# Patient Record
Sex: Female | Born: 1960
Health system: Southern US, Community
[De-identification: ages and names within clinical notes are randomized; demographics above are authoritative.]

## PROBLEM LIST (undated history)

## (undated) DIAGNOSIS — E079 Disorder of thyroid, unspecified: Secondary | ICD-10-CM

## (undated) DIAGNOSIS — F419 Anxiety disorder, unspecified: Secondary | ICD-10-CM

## (undated) DIAGNOSIS — I10 Essential (primary) hypertension: Secondary | ICD-10-CM

## (undated) DIAGNOSIS — E559 Vitamin D deficiency, unspecified: Secondary | ICD-10-CM

## (undated) DIAGNOSIS — J439 Emphysema, unspecified: Secondary | ICD-10-CM

## (undated) HISTORY — DX: Essential (primary) hypertension: I10

## (undated) HISTORY — DX: Disorder of thyroid, unspecified: E07.9

## (undated) HISTORY — PX: CERVIX SURGERY: SHX593

## (undated) HISTORY — DX: Anxiety disorder, unspecified: F41.9

## (undated) HISTORY — DX: Emphysema, unspecified: J43.9

## (undated) HISTORY — DX: Vitamin D deficiency, unspecified: E55.9

## (undated) HISTORY — PX: OTHER SURGICAL HISTORY: SHX169

---

## 2009-08-18 ENCOUNTER — Other Ambulatory Visit: Admission: RE | Admit: 2009-08-18 | Discharge: 2009-08-18 | Payer: Self-pay | Admitting: Family Medicine

## 2009-09-05 ENCOUNTER — Encounter: Admission: RE | Admit: 2009-09-05 | Discharge: 2009-09-05 | Payer: Self-pay | Admitting: Family Medicine

## 2009-09-12 ENCOUNTER — Encounter: Admission: RE | Admit: 2009-09-12 | Discharge: 2009-09-12 | Payer: Self-pay | Admitting: Family Medicine

## 2011-03-13 ENCOUNTER — Other Ambulatory Visit (HOSPITAL_COMMUNITY)
Admission: RE | Admit: 2011-03-13 | Discharge: 2011-03-13 | Disposition: A | Discharge status: Home / Self Care | Payer: 59 | Source: Ambulatory Visit | Attending: Family Medicine | Admitting: Family Medicine

## 2011-03-13 DIAGNOSIS — Z113 Encounter for screening for infections with a predominantly sexual mode of transmission: Secondary | ICD-10-CM | POA: Insufficient documentation

## 2011-03-13 DIAGNOSIS — Z124 Encounter for screening for malignant neoplasm of cervix: Secondary | ICD-10-CM | POA: Insufficient documentation

## 2011-03-16 ENCOUNTER — Other Ambulatory Visit: Payer: Self-pay | Admitting: Family Medicine

## 2011-03-16 DIAGNOSIS — Z1231 Encounter for screening mammogram for malignant neoplasm of breast: Secondary | ICD-10-CM

## 2011-04-04 ENCOUNTER — Ambulatory Visit
Admission: RE | Admit: 2011-04-04 | Discharge: 2011-04-04 | Disposition: A | Discharge status: Home / Self Care | Payer: 59 | Source: Ambulatory Visit | Attending: Family Medicine | Admitting: Family Medicine

## 2011-04-04 DIAGNOSIS — Z1231 Encounter for screening mammogram for malignant neoplasm of breast: Secondary | ICD-10-CM

## 2012-04-28 ENCOUNTER — Other Ambulatory Visit (HOSPITAL_COMMUNITY)
Admission: RE | Admit: 2012-04-28 | Discharge: 2012-04-28 | Disposition: A | Discharge status: Home / Self Care | Payer: BC Managed Care – PPO | Source: Ambulatory Visit | Attending: Family Medicine | Admitting: Family Medicine

## 2012-04-28 ENCOUNTER — Other Ambulatory Visit: Payer: Self-pay | Admitting: Family Medicine

## 2012-04-28 DIAGNOSIS — Z124 Encounter for screening for malignant neoplasm of cervix: Secondary | ICD-10-CM | POA: Insufficient documentation

## 2012-05-13 ENCOUNTER — Other Ambulatory Visit: Payer: Self-pay | Admitting: Family Medicine

## 2012-05-13 DIAGNOSIS — Z1231 Encounter for screening mammogram for malignant neoplasm of breast: Secondary | ICD-10-CM

## 2012-06-16 ENCOUNTER — Ambulatory Visit
Admission: RE | Admit: 2012-06-16 | Discharge: 2012-06-16 | Disposition: A | Discharge status: Home / Self Care | Payer: BC Managed Care – PPO | Source: Ambulatory Visit | Attending: Family Medicine | Admitting: Family Medicine

## 2012-06-16 DIAGNOSIS — Z1231 Encounter for screening mammogram for malignant neoplasm of breast: Secondary | ICD-10-CM

## 2013-03-24 ENCOUNTER — Other Ambulatory Visit: Payer: Self-pay | Admitting: Family Medicine

## 2013-03-24 ENCOUNTER — Ambulatory Visit
Admission: RE | Admit: 2013-03-24 | Discharge: 2013-03-24 | Disposition: A | Discharge status: Home / Self Care | Payer: BC Managed Care – PPO | Source: Ambulatory Visit | Attending: Family Medicine | Admitting: Family Medicine

## 2013-03-24 DIAGNOSIS — R109 Unspecified abdominal pain: Secondary | ICD-10-CM

## 2013-03-24 DIAGNOSIS — R102 Pelvic and perineal pain: Secondary | ICD-10-CM

## 2013-03-24 MED ORDER — IOHEXOL 300 MG/ML  SOLN: 100.0000 mL | Freq: Once | INTRAMUSCULAR | Status: AC | PRN

## 2013-05-13 ENCOUNTER — Other Ambulatory Visit: Payer: Self-pay | Admitting: Gastroenterology

## 2013-07-16 ENCOUNTER — Emergency Department (HOSPITAL_COMMUNITY)
Admission: EM | Admit: 2013-07-16 | Discharge: 2013-07-16 | Disposition: A | Discharge status: Home / Self Care | Payer: BC Managed Care – PPO | Source: Home / Self Care | Attending: Emergency Medicine | Admitting: Emergency Medicine

## 2013-07-16 ENCOUNTER — Encounter (HOSPITAL_COMMUNITY): Payer: Self-pay | Admitting: Emergency Medicine

## 2013-07-16 DIAGNOSIS — N3000 Acute cystitis without hematuria: Secondary | ICD-10-CM

## 2013-07-16 LAB — POCT URINALYSIS DIP (DEVICE)
Glucose, UA: NEGATIVE mg/dL
Hgb urine dipstick: NEGATIVE
Nitrite: NEGATIVE
Protein, ur: NEGATIVE mg/dL
Specific Gravity, Urine: 1.015 (ref 1.005–1.030)
Urobilinogen, UA: 0.2 mg/dL (ref 0.0–1.0)
pH: 7 (ref 5.0–8.0)

## 2013-07-16 MED ORDER — PHENAZOPYRIDINE HCL 200 MG PO TABS: 200.0000 mg | ORAL_TABLET | Freq: Three times a day (TID) | ORAL | Status: DC | PRN

## 2013-07-16 MED ORDER — CEPHALEXIN 500 MG PO CAPS: 500.0000 mg | ORAL_CAPSULE | Freq: Three times a day (TID) | ORAL | Status: DC

## 2013-07-16 NOTE — ED Notes (Signed)
Pt  Reports  Symptoms  Of  Dysuria        And  Sensation of  Not  Fully  emptying bladder       She  denys  Any other  Symptoms  She  Reports  Symptoms  Began this  Am

## 2013-07-16 NOTE — ED Provider Notes (Signed)
Chief Complaint:   Chief Complaint  Patient presents with  . Dysuria    History of Present Illness:   Alicia Rubio is a 52 year old female who has had a history since this morning of pain with urination and suprapubic pain. She denies any frequency, urgency, hematuria, malodorous urine, fever, chills, nausea, or vomiting. She's had no GYN complaints. The patient states she has had urinary tract infections in the past. Most recently 8 years ago.  Review of Systems:  Other than noted above, the patient denies any of the following symptoms: General:  No fevers, chills, sweats, aches, or fatigue. GI:  No abdominal pain, back pain, nausea, vomiting, diarrhea, or constipation. GU:  No dysuria, frequency, urgency, hematuria, or incontinence. GYN:  No discharge, itching, vulvar pain or lesions, pelvic pain, or abnormal vaginal bleeding.  PMFSH:  Past medical history, family history, social history, meds, and allergies were reviewed.  She is allergic to minocycline. She has high blood pressure and hypothyroidism. She takes Effexor, Synthroid, a blood pressure pill, Xanax, and Ambien.  Physical Exam:   Vital signs:  BP 172/94  Pulse 89  Temp(Src) 98.7 F (37.1 C) (Oral)  Resp 16  Ht 5\' 2"  (1.575 m)  Wt 125 lb (56.7 kg)  BMI 22.86 kg/m2  SpO2 98% Gen:  Alert, oriented, in no distress. Lungs:  Clear to auscultation, no wheezes, rales or rhonchi. Heart:  Regular rhythm, no gallop or murmer. Abdomen:  Flat and soft. There was slight suprapubic pain to palpation.  No guarding, or rebound.  No hepato-splenomegaly or mass.  Bowel sounds were normally active.  No hernia. Back:  No CVA tenderness.  Skin:  Clear, warm and dry.  Labs:    Results for orders placed during the hospital encounter of 07/16/13  POCT URINALYSIS DIP (DEVICE)      Result Value Range   Glucose, UA NEGATIVE  NEGATIVE mg/dL   Bilirubin Urine NEGATIVE  NEGATIVE   Ketones, ur NEGATIVE  NEGATIVE mg/dL   Specific Gravity, Urine  1.015  1.005 - 1.030   Hgb urine dipstick NEGATIVE  NEGATIVE   pH 7.0  5.0 - 8.0   Protein, ur NEGATIVE  NEGATIVE mg/dL   Urobilinogen, UA 0.2  0.0 - 1.0 mg/dL   Nitrite NEGATIVE  NEGATIVE   Leukocytes, UA NEGATIVE  NEGATIVE     A urine culture was obtained.  Results are pending at this time and we will call about any positive results.  Assessment: The encounter diagnosis was Acute cystitis.   No evidence of acute pyelonephritis.  Plan:   1.  Meds:  The following meds were prescribed:   Discharge Medication List as of 07/16/2013 12:26 PM    START taking these medications   Details  cephALEXin (KEFLEX) 500 MG capsule Take 1 capsule (500 mg total) by mouth 3 (three) times daily., Starting 07/16/2013, Until Discontinued, Normal    phenazopyridine (PYRIDIUM) 200 MG tablet Take 1 tablet (200 mg total) by mouth 3 (three) times daily as needed for pain., Starting 07/16/2013, Until Discontinued, Normal        2.  Patient Education/Counseling:  The patient was given appropriate handouts, self care instructions, and instructed in symptomatic relief. The patient was told to avoid intercourse for 10 days, get extra fluids, and return for a follow up with her primary care doctor at the completion of treatment for a repeat UA and culture.   3.  Follow up:  The patient was told to follow up if no better  in 3 to 4 days, if becoming worse in any way, and given some red flag symptoms such as fever, severe flank pain, or persistent vomiting which would prompt immediate return.  Follow up here or at the emergency department as needed.     Reuben Likes, MD 07/16/13 1336

## 2013-07-17 LAB — URINE CULTURE

## 2013-09-04 ENCOUNTER — Other Ambulatory Visit: Payer: Self-pay

## 2013-09-04 DIAGNOSIS — Z1231 Encounter for screening mammogram for malignant neoplasm of breast: Secondary | ICD-10-CM

## 2013-09-16 ENCOUNTER — Ambulatory Visit
Admission: RE | Admit: 2013-09-16 | Discharge: 2013-09-16 | Disposition: A | Discharge status: Home / Self Care | Payer: BC Managed Care – PPO | Source: Ambulatory Visit

## 2013-09-16 DIAGNOSIS — Z1231 Encounter for screening mammogram for malignant neoplasm of breast: Secondary | ICD-10-CM

## 2014-09-27 ENCOUNTER — Other Ambulatory Visit (HOSPITAL_COMMUNITY)
Admission: RE | Admit: 2014-09-27 | Discharge: 2014-09-27 | Disposition: A | Discharge status: Home / Self Care | Payer: BLUE CROSS/BLUE SHIELD | Source: Ambulatory Visit | Attending: Family Medicine | Admitting: Family Medicine

## 2014-09-27 ENCOUNTER — Other Ambulatory Visit: Payer: Self-pay | Admitting: Family Medicine

## 2014-09-27 DIAGNOSIS — Z1151 Encounter for screening for human papillomavirus (HPV): Secondary | ICD-10-CM | POA: Insufficient documentation

## 2014-09-27 DIAGNOSIS — Z124 Encounter for screening for malignant neoplasm of cervix: Secondary | ICD-10-CM | POA: Diagnosis not present

## 2014-09-29 LAB — CYTOLOGY - PAP

## 2014-10-08 ENCOUNTER — Other Ambulatory Visit: Payer: Self-pay

## 2014-10-08 DIAGNOSIS — Z1231 Encounter for screening mammogram for malignant neoplasm of breast: Secondary | ICD-10-CM

## 2014-10-13 ENCOUNTER — Ambulatory Visit
Admission: RE | Admit: 2014-10-13 | Discharge: 2014-10-13 | Disposition: A | Discharge status: Home / Self Care | Payer: BLUE CROSS/BLUE SHIELD | Source: Ambulatory Visit

## 2014-10-13 DIAGNOSIS — Z1231 Encounter for screening mammogram for malignant neoplasm of breast: Secondary | ICD-10-CM

## 2015-11-16 ENCOUNTER — Other Ambulatory Visit: Payer: Self-pay | Admitting: Family Medicine

## 2015-11-16 DIAGNOSIS — I10 Essential (primary) hypertension: Secondary | ICD-10-CM | POA: Diagnosis not present

## 2015-11-16 DIAGNOSIS — E039 Hypothyroidism, unspecified: Secondary | ICD-10-CM | POA: Diagnosis not present

## 2015-11-16 DIAGNOSIS — Z0000 Encounter for general adult medical examination without abnormal findings: Secondary | ICD-10-CM | POA: Diagnosis not present

## 2015-11-16 DIAGNOSIS — R202 Paresthesia of skin: Secondary | ICD-10-CM

## 2015-11-21 ENCOUNTER — Inpatient Hospital Stay: Admission: RE | Admit: 2015-11-21 | Payer: BLUE CROSS/BLUE SHIELD | Source: Ambulatory Visit

## 2015-11-22 ENCOUNTER — Other Ambulatory Visit: Payer: Self-pay

## 2015-11-22 DIAGNOSIS — Z1231 Encounter for screening mammogram for malignant neoplasm of breast: Secondary | ICD-10-CM

## 2015-12-08 ENCOUNTER — Ambulatory Visit
Admission: RE | Admit: 2015-12-08 | Discharge: 2015-12-08 | Disposition: A | Discharge status: Home / Self Care | Payer: BLUE CROSS/BLUE SHIELD | Source: Ambulatory Visit

## 2015-12-08 DIAGNOSIS — Z1231 Encounter for screening mammogram for malignant neoplasm of breast: Secondary | ICD-10-CM

## 2016-06-08 DIAGNOSIS — E039 Hypothyroidism, unspecified: Secondary | ICD-10-CM | POA: Diagnosis not present

## 2016-06-08 DIAGNOSIS — I10 Essential (primary) hypertension: Secondary | ICD-10-CM | POA: Diagnosis not present

## 2016-06-08 DIAGNOSIS — Z720 Tobacco use: Secondary | ICD-10-CM | POA: Diagnosis not present

## 2016-06-08 DIAGNOSIS — F322 Major depressive disorder, single episode, severe without psychotic features: Secondary | ICD-10-CM | POA: Diagnosis not present

## 2016-06-11 ENCOUNTER — Ambulatory Visit (INDEPENDENT_AMBULATORY_CARE_PROVIDER_SITE_OTHER): Payer: BLUE CROSS/BLUE SHIELD | Admitting: Orthopaedic Surgery

## 2016-06-11 ENCOUNTER — Encounter (INDEPENDENT_AMBULATORY_CARE_PROVIDER_SITE_OTHER): Payer: Self-pay | Admitting: Orthopaedic Surgery

## 2016-06-11 DIAGNOSIS — G5622 Lesion of ulnar nerve, left upper limb: Secondary | ICD-10-CM | POA: Diagnosis not present

## 2016-06-11 NOTE — Progress Notes (Signed)
   Office Visit Note   Patient: Alicia Rubio           Date of Birth: January 30, 1961           MRN: [id number] Visit Date: 06/11/2016              Requested by: Mayra Neer, MD [address] [place], [place] [address] PCP: No PCP Per Patient   Assessment & Plan: Visit Diagnoses: No diagnosis found.  Plan:  - findings c/w ulnar neuritis - educated patient on elbow positioning and activities to avoid - nsaids prn - if not better will get NCV  Follow-Up Instructions: Return if symptoms worsen or fail to improve.   Orders:  No orders of the defined types were placed in this encounter.  No orders of the defined types were placed in this encounter.     Procedures: No procedures performed   Clinical Data: No additional findings.   Subjective: Chief Complaint  Patient presents with  . Left Arm - Numbness  . Left Hand - Pain    55 yo female with left arm numbness worse with elbow flexion and occasionally wakes up with arm numbness.  Not really having pain, more so numbness.  Denies any focal deficits.  Occasional neck pain but no real radicular sxs.  Keeping elbow straight is better.  Denies f/c/n/v.    Review of Systems  Constitutional: Negative.   HENT: Negative.   Eyes: Negative.   Respiratory: Negative.   Cardiovascular: Negative.   Endocrine: Negative.   Musculoskeletal: Negative.   Neurological: Negative.   Hematological: Negative.   Psychiatric/Behavioral: Negative.   All other systems reviewed and are negative.    Objective: Vital Signs: There were no vitals taken for this visit.  Physical Exam  Constitutional: She is oriented to person, place, and time. She appears well-developed and well-nourished.  HENT:  Head: Atraumatic.  Eyes: EOM are normal.  Neck: Neck supple.  Cardiovascular: Intact distal pulses.   Pulmonary/Chest: Effort normal.  Abdominal: Soft.  Neurological: She is alert and oriented to person, place, and time.  Skin:  Skin is warm. Capillary refill takes less than 2 seconds.  Psychiatric: She has a normal mood and affect. Her behavior is normal. Judgment and thought content normal.  Nursing note and vitals reviewed.   Left Hand Exam   Muscle Strength  The patient has normal left wrist strength.  Tests  Phalen's Sign: negative Tinel's Sign (Medial Nerve): positive  Comments:  Positive durkan's.  Negative muscular atrophy.  Negative froment's, wartenberg.   Left Elbow Exam   Range of Motion  The patient has normal left elbow ROM.  Muscle Strength  The patient has normal left elbow strength.  Tests Varus: negative Valgus: negative Tinel's Sign (cubital tunnel): positive  Other  Erythema: absent Scars: absent Sensation: normal Pulse: present      Specialty Comments:  No specialty comments available.  Imaging: No results found.   PMFS History: There are no active problems to display for this patient.  No past medical history on file.  No family history on file.  No past surgical history on file. Social History   Occupational History  . Not on file.   Social History Main Topics  . Smoking status: Current Every Day Smoker  . Smokeless tobacco: Never Used  . Alcohol use Not on file  . Drug use: Unknown  . Sexual activity: Not on file

## 2016-07-24 DIAGNOSIS — L308 Other specified dermatitis: Secondary | ICD-10-CM | POA: Diagnosis not present

## 2016-07-24 DIAGNOSIS — B360 Pityriasis versicolor: Secondary | ICD-10-CM | POA: Diagnosis not present

## 2016-09-24 ENCOUNTER — Ambulatory Visit
Admission: RE | Admit: 2016-09-24 | Discharge: 2016-09-24 | Disposition: A | Discharge status: Home / Self Care | Payer: BLUE CROSS/BLUE SHIELD | Source: Ambulatory Visit | Attending: Family Medicine | Admitting: Family Medicine

## 2016-09-24 ENCOUNTER — Other Ambulatory Visit: Payer: Self-pay | Admitting: Family Medicine

## 2016-09-24 DIAGNOSIS — R202 Paresthesia of skin: Secondary | ICD-10-CM | POA: Diagnosis not present

## 2016-09-24 DIAGNOSIS — E039 Hypothyroidism, unspecified: Secondary | ICD-10-CM | POA: Diagnosis not present

## 2016-09-24 DIAGNOSIS — R05 Cough: Secondary | ICD-10-CM

## 2016-09-24 DIAGNOSIS — R059 Cough, unspecified: Secondary | ICD-10-CM

## 2016-09-24 DIAGNOSIS — F322 Major depressive disorder, single episode, severe without psychotic features: Secondary | ICD-10-CM | POA: Diagnosis not present

## 2016-09-24 DIAGNOSIS — Z720 Tobacco use: Secondary | ICD-10-CM | POA: Diagnosis not present

## 2016-10-08 DIAGNOSIS — F322 Major depressive disorder, single episode, severe without psychotic features: Secondary | ICD-10-CM | POA: Diagnosis not present

## 2016-10-08 DIAGNOSIS — Z720 Tobacco use: Secondary | ICD-10-CM | POA: Diagnosis not present

## 2016-10-08 DIAGNOSIS — J42 Unspecified chronic bronchitis: Secondary | ICD-10-CM | POA: Diagnosis not present

## 2016-10-08 DIAGNOSIS — J189 Pneumonia, unspecified organism: Secondary | ICD-10-CM | POA: Diagnosis not present

## 2016-10-29 DIAGNOSIS — Z720 Tobacco use: Secondary | ICD-10-CM | POA: Diagnosis not present

## 2016-10-29 DIAGNOSIS — F322 Major depressive disorder, single episode, severe without psychotic features: Secondary | ICD-10-CM | POA: Diagnosis not present

## 2016-10-29 DIAGNOSIS — G4700 Insomnia, unspecified: Secondary | ICD-10-CM | POA: Diagnosis not present

## 2016-11-23 DIAGNOSIS — G4700 Insomnia, unspecified: Secondary | ICD-10-CM | POA: Diagnosis not present

## 2016-11-23 DIAGNOSIS — F322 Major depressive disorder, single episode, severe without psychotic features: Secondary | ICD-10-CM | POA: Diagnosis not present

## 2016-11-23 DIAGNOSIS — Z720 Tobacco use: Secondary | ICD-10-CM | POA: Diagnosis not present

## 2016-12-26 ENCOUNTER — Other Ambulatory Visit: Payer: Self-pay | Admitting: Family Medicine

## 2016-12-26 DIAGNOSIS — Z1231 Encounter for screening mammogram for malignant neoplasm of breast: Secondary | ICD-10-CM

## 2017-01-04 DIAGNOSIS — Z0000 Encounter for general adult medical examination without abnormal findings: Secondary | ICD-10-CM | POA: Diagnosis not present

## 2017-01-04 DIAGNOSIS — I10 Essential (primary) hypertension: Secondary | ICD-10-CM | POA: Diagnosis not present

## 2017-01-04 DIAGNOSIS — E039 Hypothyroidism, unspecified: Secondary | ICD-10-CM | POA: Diagnosis not present

## 2017-01-15 ENCOUNTER — Ambulatory Visit
Admission: RE | Admit: 2017-01-15 | Discharge: 2017-01-15 | Disposition: A | Discharge status: Home / Self Care | Payer: BLUE CROSS/BLUE SHIELD | Source: Ambulatory Visit | Attending: Family Medicine | Admitting: Family Medicine

## 2017-01-15 DIAGNOSIS — Z1231 Encounter for screening mammogram for malignant neoplasm of breast: Secondary | ICD-10-CM

## 2017-02-12 DIAGNOSIS — J343 Hypertrophy of nasal turbinates: Secondary | ICD-10-CM | POA: Diagnosis not present

## 2017-02-12 DIAGNOSIS — F17210 Nicotine dependence, cigarettes, uncomplicated: Secondary | ICD-10-CM | POA: Diagnosis not present

## 2017-02-12 DIAGNOSIS — K219 Gastro-esophageal reflux disease without esophagitis: Secondary | ICD-10-CM | POA: Diagnosis not present

## 2017-02-19 DIAGNOSIS — R945 Abnormal results of liver function studies: Secondary | ICD-10-CM | POA: Diagnosis not present

## 2017-04-16 DIAGNOSIS — K219 Gastro-esophageal reflux disease without esophagitis: Secondary | ICD-10-CM | POA: Diagnosis not present

## 2017-04-16 DIAGNOSIS — F458 Other somatoform disorders: Secondary | ICD-10-CM | POA: Diagnosis not present

## 2017-06-19 DIAGNOSIS — F458 Other somatoform disorders: Secondary | ICD-10-CM | POA: Diagnosis not present

## 2017-07-10 DIAGNOSIS — E039 Hypothyroidism, unspecified: Secondary | ICD-10-CM | POA: Diagnosis not present

## 2017-07-10 DIAGNOSIS — I10 Essential (primary) hypertension: Secondary | ICD-10-CM | POA: Diagnosis not present

## 2017-07-10 DIAGNOSIS — G4700 Insomnia, unspecified: Secondary | ICD-10-CM | POA: Diagnosis not present

## 2017-07-10 DIAGNOSIS — Z720 Tobacco use: Secondary | ICD-10-CM | POA: Diagnosis not present

## 2017-07-16 DIAGNOSIS — M79601 Pain in right arm: Secondary | ICD-10-CM | POA: Diagnosis not present

## 2017-07-16 DIAGNOSIS — M542 Cervicalgia: Secondary | ICD-10-CM | POA: Diagnosis not present

## 2017-07-16 DIAGNOSIS — M79602 Pain in left arm: Secondary | ICD-10-CM | POA: Diagnosis not present

## 2017-07-16 DIAGNOSIS — M792 Neuralgia and neuritis, unspecified: Secondary | ICD-10-CM | POA: Diagnosis not present

## 2017-08-16 DIAGNOSIS — M792 Neuralgia and neuritis, unspecified: Secondary | ICD-10-CM | POA: Diagnosis not present

## 2017-08-16 DIAGNOSIS — M542 Cervicalgia: Secondary | ICD-10-CM | POA: Diagnosis not present

## 2017-08-16 DIAGNOSIS — M79602 Pain in left arm: Secondary | ICD-10-CM | POA: Diagnosis not present

## 2017-08-16 DIAGNOSIS — M79601 Pain in right arm: Secondary | ICD-10-CM | POA: Diagnosis not present

## 2017-08-21 DIAGNOSIS — M79602 Pain in left arm: Secondary | ICD-10-CM | POA: Diagnosis not present

## 2017-08-21 DIAGNOSIS — M542 Cervicalgia: Secondary | ICD-10-CM | POA: Diagnosis not present

## 2017-08-21 DIAGNOSIS — M792 Neuralgia and neuritis, unspecified: Secondary | ICD-10-CM | POA: Diagnosis not present

## 2017-08-21 DIAGNOSIS — M79601 Pain in right arm: Secondary | ICD-10-CM | POA: Diagnosis not present

## 2017-08-28 DIAGNOSIS — M79601 Pain in right arm: Secondary | ICD-10-CM | POA: Diagnosis not present

## 2017-08-28 DIAGNOSIS — M79602 Pain in left arm: Secondary | ICD-10-CM | POA: Diagnosis not present

## 2017-08-28 DIAGNOSIS — M792 Neuralgia and neuritis, unspecified: Secondary | ICD-10-CM | POA: Diagnosis not present

## 2017-08-28 DIAGNOSIS — M542 Cervicalgia: Secondary | ICD-10-CM | POA: Diagnosis not present

## 2018-02-03 DIAGNOSIS — Z0000 Encounter for general adult medical examination without abnormal findings: Secondary | ICD-10-CM | POA: Diagnosis not present

## 2018-02-03 DIAGNOSIS — I10 Essential (primary) hypertension: Secondary | ICD-10-CM | POA: Diagnosis not present

## 2018-02-03 DIAGNOSIS — E039 Hypothyroidism, unspecified: Secondary | ICD-10-CM | POA: Diagnosis not present

## 2018-02-05 ENCOUNTER — Other Ambulatory Visit: Payer: Self-pay | Admitting: Family Medicine

## 2018-02-05 DIAGNOSIS — Z1231 Encounter for screening mammogram for malignant neoplasm of breast: Secondary | ICD-10-CM

## 2018-02-26 ENCOUNTER — Ambulatory Visit
Admission: RE | Admit: 2018-02-26 | Discharge: 2018-02-26 | Disposition: A | Discharge status: Home / Self Care | Payer: BLUE CROSS/BLUE SHIELD | Source: Ambulatory Visit | Attending: Family Medicine | Admitting: Family Medicine

## 2018-02-26 ENCOUNTER — Other Ambulatory Visit: Payer: Self-pay | Admitting: Family Medicine

## 2018-02-26 DIAGNOSIS — R059 Cough, unspecified: Secondary | ICD-10-CM

## 2018-02-26 DIAGNOSIS — Z1231 Encounter for screening mammogram for malignant neoplasm of breast: Secondary | ICD-10-CM | POA: Diagnosis not present

## 2018-02-26 DIAGNOSIS — R05 Cough: Secondary | ICD-10-CM

## 2018-06-04 DIAGNOSIS — R197 Diarrhea, unspecified: Secondary | ICD-10-CM | POA: Diagnosis not present

## 2018-08-11 DIAGNOSIS — F322 Major depressive disorder, single episode, severe without psychotic features: Secondary | ICD-10-CM | POA: Diagnosis not present

## 2018-08-11 DIAGNOSIS — E039 Hypothyroidism, unspecified: Secondary | ICD-10-CM | POA: Diagnosis not present

## 2018-08-11 DIAGNOSIS — I10 Essential (primary) hypertension: Secondary | ICD-10-CM | POA: Diagnosis not present

## 2018-08-11 DIAGNOSIS — G4700 Insomnia, unspecified: Secondary | ICD-10-CM | POA: Diagnosis not present

## 2018-11-24 DIAGNOSIS — J42 Unspecified chronic bronchitis: Secondary | ICD-10-CM | POA: Diagnosis not present

## 2018-11-24 DIAGNOSIS — Z720 Tobacco use: Secondary | ICD-10-CM | POA: Diagnosis not present

## 2018-11-24 DIAGNOSIS — I10 Essential (primary) hypertension: Secondary | ICD-10-CM | POA: Diagnosis not present

## 2018-11-24 DIAGNOSIS — K047 Periapical abscess without sinus: Secondary | ICD-10-CM | POA: Diagnosis not present

## 2019-01-06 IMAGING — DX DG CHEST 2V
2 series · 2 of 2 positions shown · non-contrast
Comparison: None in PACs

CLINICAL DATA: Cough, chest congestion for the past 3 weeks. The
patient is a current smoker.

EXAM:
CHEST  2 VIEW

[dg chest 2 view (1 of 2)]
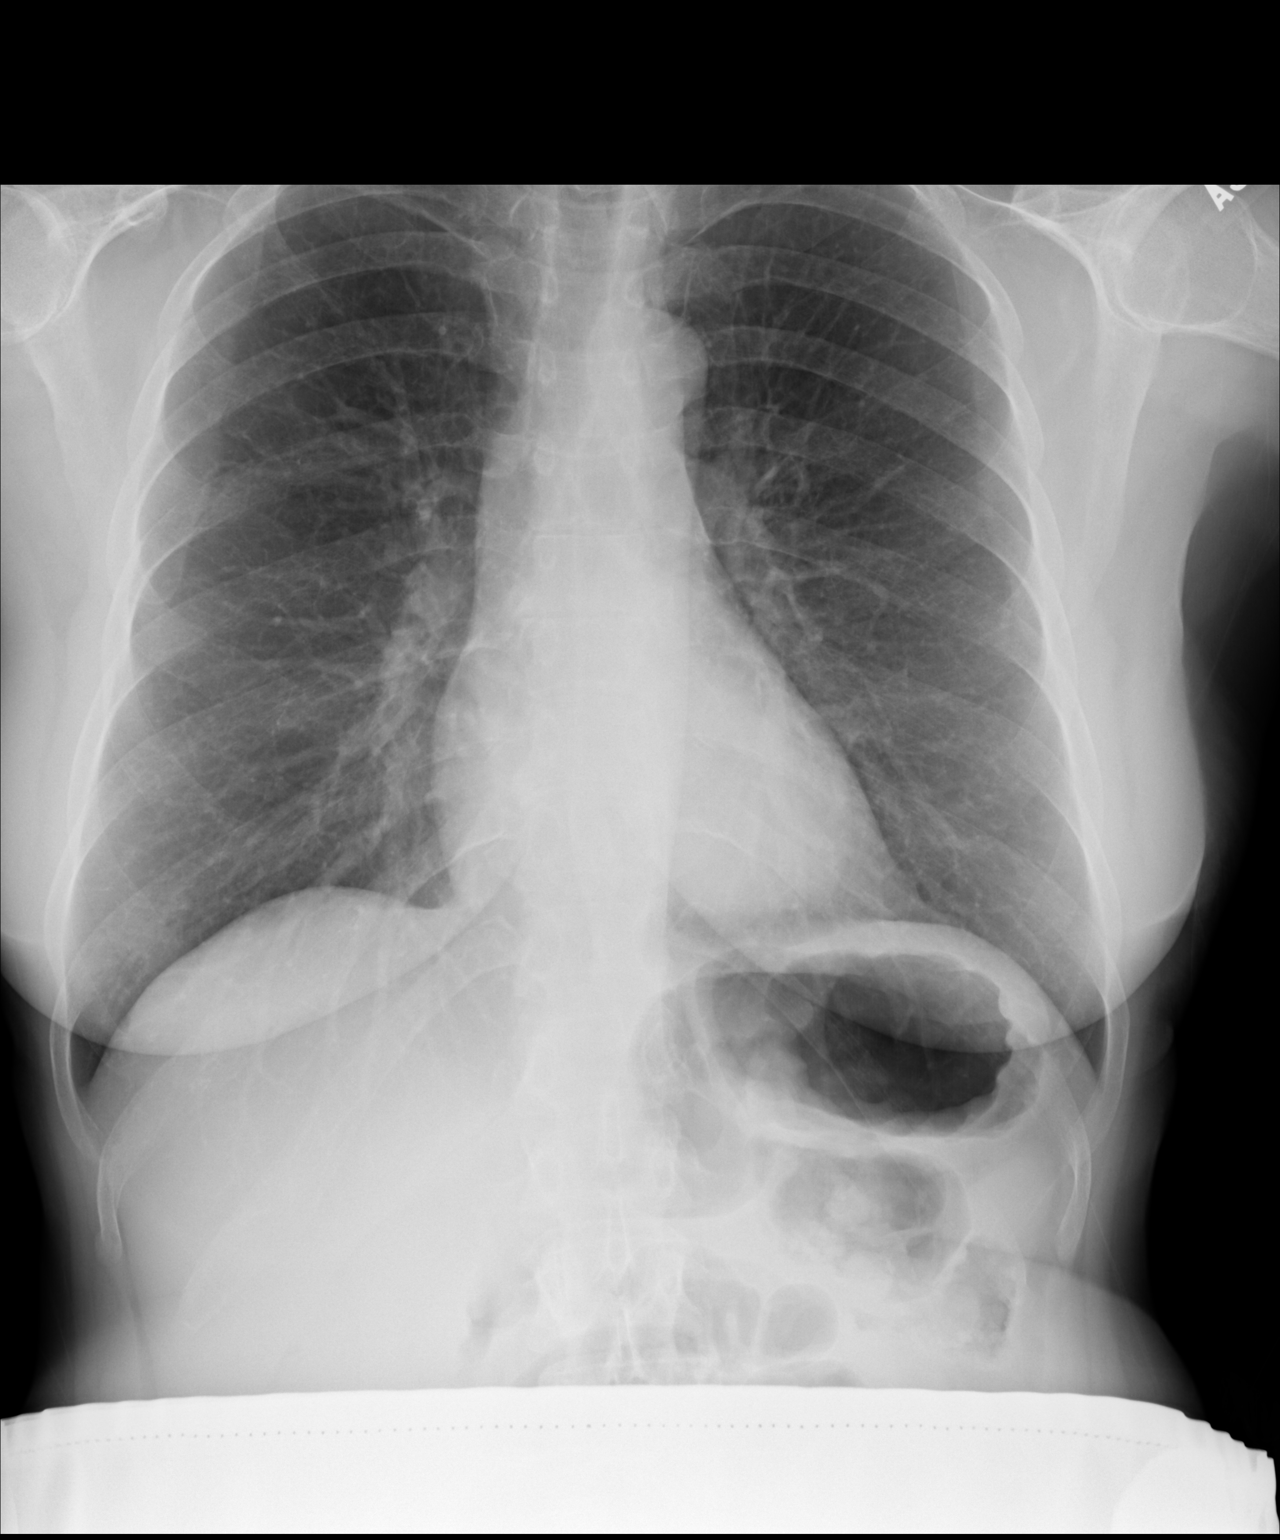

[dg chest 2 view (2 of 2)]
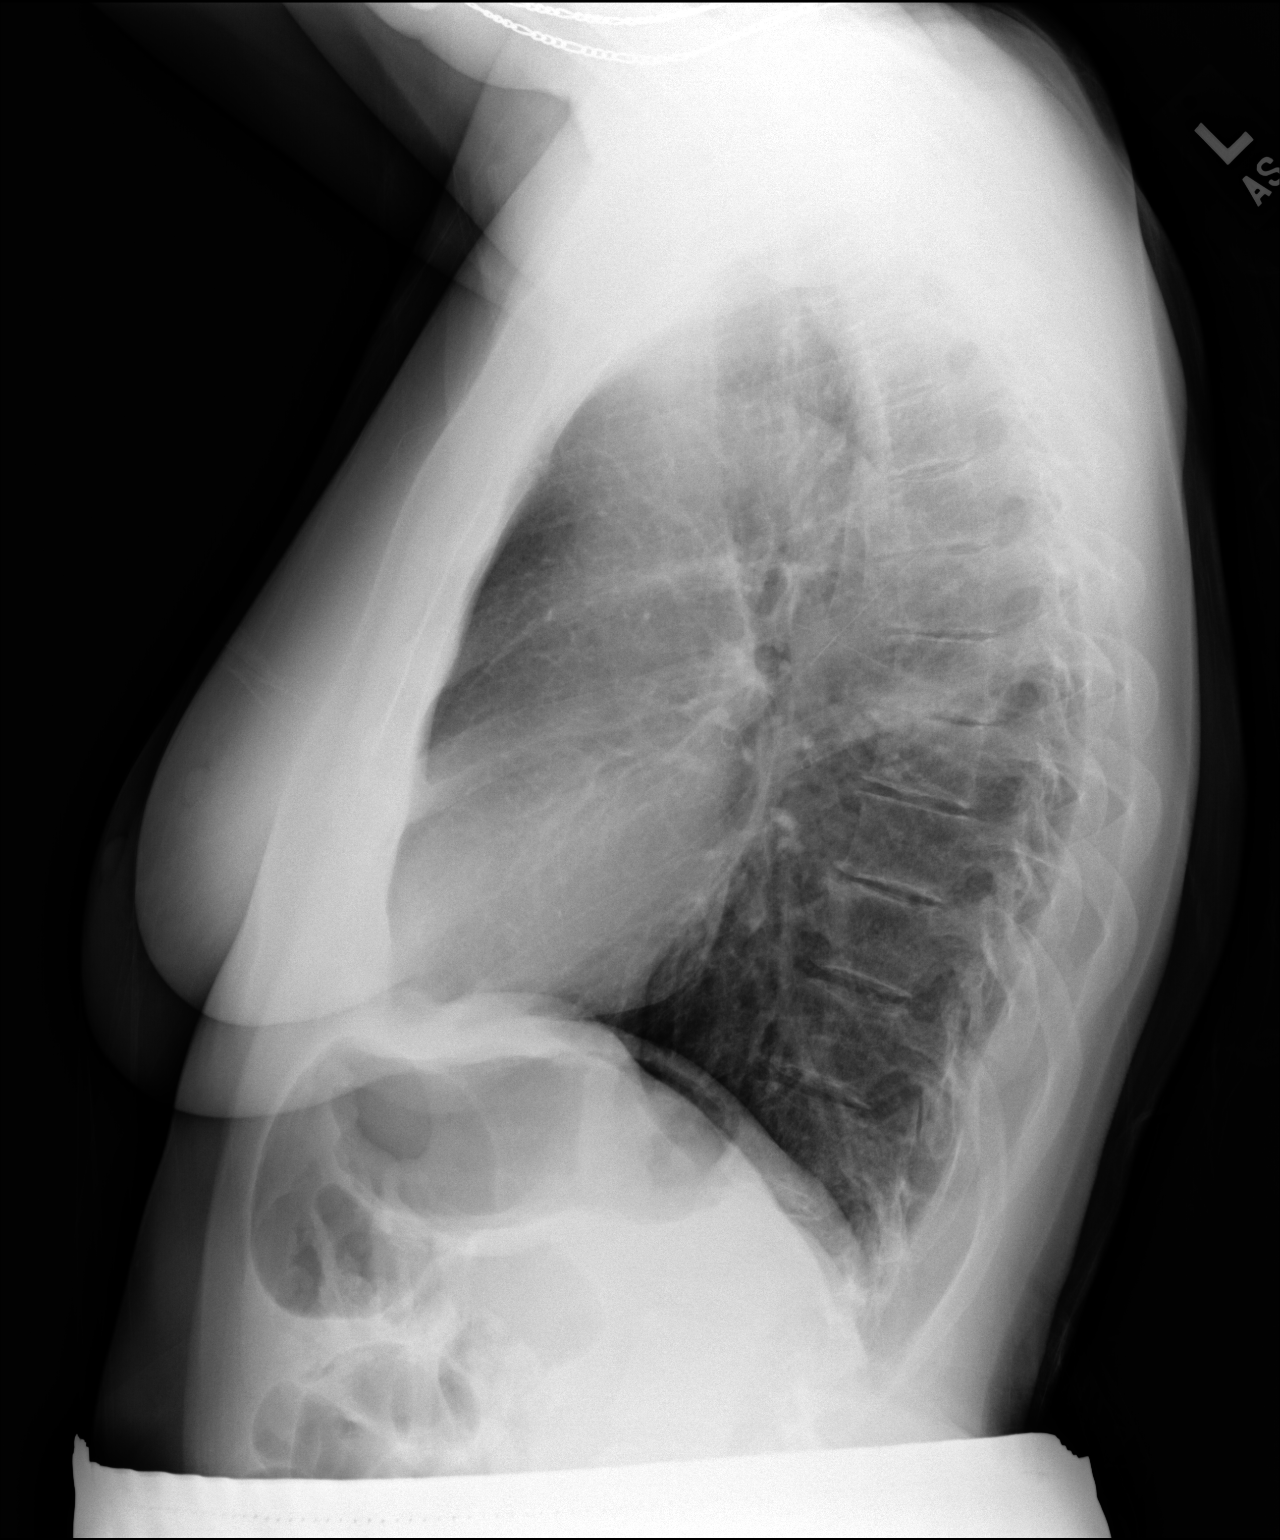

[2 of 2 positions shown; findings below may reference images not displayed]

FINDINGS: The lungs are adequately inflated. There is subtle increased density
in the mid thorax posteriorly on the lateral view that may reflect
atelectasis or pneumonia likely in the superior segment of the left
lower lobe. There is no pleural effusion. The heart and pulmonary
vascularity are normal. There is calcification in the wall of the
aortic arch. The mediastinum is normal in width. There is multilevel
degenerative disc disease of the thoracic spine.
IMPRESSION: Increased density likely in the superior segment of the left lower
lobe suggests atelectasis or pneumonia. Probable underlying chronic
bronchitic changes. Followup PA and lateral chest X-ray is
recommended in 3-4 weeks following trial of antibiotic therapy to
ensure resolution and exclude underlying malignancy.

Thoracic aortic atherosclerosis.

## 2019-02-02 DIAGNOSIS — H6122 Impacted cerumen, left ear: Secondary | ICD-10-CM | POA: Diagnosis not present

## 2019-02-20 DIAGNOSIS — Z0000 Encounter for general adult medical examination without abnormal findings: Secondary | ICD-10-CM | POA: Diagnosis not present

## 2019-02-25 DIAGNOSIS — R05 Cough: Secondary | ICD-10-CM | POA: Diagnosis not present

## 2019-02-25 DIAGNOSIS — J029 Acute pharyngitis, unspecified: Secondary | ICD-10-CM | POA: Diagnosis not present

## 2019-02-28 ENCOUNTER — Other Ambulatory Visit: Payer: Self-pay

## 2019-02-28 ENCOUNTER — Emergency Department (HOSPITAL_COMMUNITY)
Admission: EM | Admit: 2019-02-28 | Discharge: 2019-02-28 | Disposition: A | Discharge status: Home / Self Care | Payer: BC Managed Care – PPO | Attending: Emergency Medicine | Admitting: Emergency Medicine

## 2019-02-28 ENCOUNTER — Emergency Department (HOSPITAL_COMMUNITY): Payer: BC Managed Care – PPO

## 2019-02-28 ENCOUNTER — Encounter (HOSPITAL_COMMUNITY): Payer: Self-pay | Admitting: Emergency Medicine

## 2019-02-28 DIAGNOSIS — Z79899 Other long term (current) drug therapy: Secondary | ICD-10-CM | POA: Diagnosis not present

## 2019-02-28 DIAGNOSIS — R05 Cough: Secondary | ICD-10-CM | POA: Insufficient documentation

## 2019-02-28 DIAGNOSIS — F17200 Nicotine dependence, unspecified, uncomplicated: Secondary | ICD-10-CM | POA: Insufficient documentation

## 2019-02-28 DIAGNOSIS — J029 Acute pharyngitis, unspecified: Secondary | ICD-10-CM | POA: Diagnosis not present

## 2019-02-28 DIAGNOSIS — Z20828 Contact with and (suspected) exposure to other viral communicable diseases: Secondary | ICD-10-CM | POA: Diagnosis not present

## 2019-02-28 DIAGNOSIS — R059 Cough, unspecified: Secondary | ICD-10-CM

## 2019-02-28 LAB — GROUP A STREP BY PCR: Group A Strep by PCR: NOT DETECTED

## 2019-02-28 MED ORDER — AZITHROMYCIN 250 MG PO TABS: 250.0000 mg | ORAL_TABLET | Freq: Every day | ORAL | 0 refills | Status: DC

## 2019-02-28 MED ORDER — PREDNISONE 50 MG PO TABS: 50.0000 mg | ORAL_TABLET | Freq: Every day | ORAL | 0 refills | Status: DC

## 2019-02-28 MED ORDER — HYDROCOD POLST-CPM POLST ER 10-8 MG/5ML PO SUER: 5.0000 mL | Freq: Two times a day (BID) | ORAL | 0 refills | Status: DC | PRN

## 2019-02-28 NOTE — ED Provider Notes (Signed)
[facility] EMERGENCY DEPARTMENT Provider Note   CSN: [id number] Arrival date & time: 02/28/19  1248     History   Chief Complaint Chief Complaint  Patient presents with  . Cough  . Sore Throat    HPI Alicia Rubio is a 58 y.o. female who presents with a one-week history of cough and sore throat.  She denies any fever, chest pain, shortness of breath.  Her cough is much worse at night and when she lies down.  Her husband had similar symptoms last week and he is over it.  She has been taking over-the-counter Delsym and Tylenol without significant relief.  Patient smokes 3 packs a day.  She denies any loss of taste or smell, abdominal pain, nausea, vomiting, diarrhea.     HPI  History reviewed. No pertinent past medical history.  Patient Active Problem List   Diagnosis Date Noted  . Ulnar neuritis, left 06/11/2016    History reviewed. No pertinent surgical history.   OB History   No obstetric history on file.      Home Medications    Prior to Admission medications   Medication Sig Start Date End Date Taking? Authorizing Provider  azithromycin (ZITHROMAX) 250 MG tablet Take 1 tablet (250 mg total) by mouth daily. Take first 2 tablets together, then 1 every day until finished. 02/28/19   Prithvi Kooi, Bea Graff, PA-C  cephALEXin (KEFLEX) 500 MG capsule Take 1 capsule (500 mg total) by mouth 3 (three) times daily. Patient not taking: Reported on 06/11/2016 07/16/13   Harden Mo, MD  chlorpheniramine-HYDROcodone [facility]) 10-8 MG/5ML SUER Take 5 mLs by mouth every 12 (twelve) hours as needed for cough. 02/28/19   Frederica Kuster, PA-C  levothyroxine (SYNTHROID, LEVOTHROID) 75 MCG tablet  05/28/16   [provider]  losartan-hydrochlorothiazide [facility]) 100-12.5 MG tablet  06/07/16   [provider]  phenazopyridine (PYRIDIUM) 200 MG tablet Take 1 tablet (200 mg total) by mouth 3 (three) times daily as needed for pain. Patient  not taking: Reported on 06/11/2016 07/16/13   Harden Mo, MD  predniSONE (DELTASONE) 50 MG tablet Take 1 tablet (50 mg total) by mouth daily. 02/28/19   Frederica Kuster, PA-C  venlafaxine XR (EFFEXOR-XR) 75 MG 24 hr capsule  05/26/16   [provider]    Family History No family history on file.  Social History Social History   Tobacco Use  . Smoking status: Current Every Day Smoker  . Smokeless tobacco: Never Used  Substance Use Topics  . Alcohol use: Not on file  . Drug use: Not on file     Allergies   Minocycline   Review of Systems Review of Systems  Constitutional: Negative for fever.  HENT: Positive for sore throat. Negative for congestion.   Respiratory: Positive for cough. Negative for shortness of breath.   Cardiovascular: Negative for chest pain.  Gastrointestinal: Negative for abdominal pain, diarrhea, nausea and vomiting.     Physical Exam Updated Vital Signs BP (!) 149/93 (BP Location: Right Arm)   Pulse (!) 109   Temp 99.4 F (37.4 C) (Oral)   Resp 20   Ht 5\' 2"  (1.575 m)   Wt 59 kg   SpO2 98%   BMI 23.78 kg/m   Physical Exam Vitals signs and nursing note reviewed.  Constitutional:      General: She is not in acute distress.    Appearance: She is well-developed. She is not diaphoretic.  HENT:  Head: Normocephalic and atraumatic.     Right Ear: There is impacted cerumen.     Left Ear: There is impacted cerumen.     Mouth/Throat:     Mouth: Mucous membranes are moist.     Pharynx: Pharyngeal swelling present. No oropharyngeal exudate.     Tonsils: No tonsillar exudate or tonsillar abscesses. 2+ on the right. 2+ on the left.  Eyes:     General: No scleral icterus.       Right eye: No discharge.        Left eye: No discharge.     Conjunctiva/sclera: Conjunctivae normal.     Pupils: Pupils are equal, round, and reactive to light.  Neck:     Musculoskeletal: Normal range of motion and neck supple.     Thyroid: No thyromegaly.   Cardiovascular:     Rate and Rhythm: Normal rate and regular rhythm.     Heart sounds: Normal heart sounds. No murmur. No friction rub. No gallop.   Pulmonary:     Effort: Pulmonary effort is normal. No respiratory distress.     Breath sounds: Normal breath sounds. No stridor. No decreased breath sounds, wheezing or rales.  Abdominal:     General: Bowel sounds are normal. There is no distension.     Palpations: Abdomen is soft.     Tenderness: There is no abdominal tenderness. There is no guarding or rebound.  Lymphadenopathy:     Cervical: No cervical adenopathy.  Skin:    General: Skin is warm and dry.     Coloration: Skin is not pale.     Findings: No rash.  Neurological:     Mental Status: She is alert.     Coordination: Coordination normal.      ED Treatments / Results  Labs (all labs ordered are listed, but only abnormal results are displayed) Labs Reviewed  GROUP A STREP BY PCR  NOVEL CORONAVIRUS, NAA (HOSPITAL ORDER, SEND-OUT TO REF LAB)    EKG None  Radiology Dg Chest Portable 1 View  Result Date: 02/28/2019 CLINICAL DATA:  Dry cough and sore throat x6 days. Pt's husband was recently in the hospital and developed a similar cough at home after being discharged. Hx of HTN. Smoker. EXAM: PORTABLE CHEST 1 VIEW COMPARISON:  02/26/2018 FINDINGS: Heart size is accentuated by the portable AP position of the patient. No focal consolidations or pleural effusions. No pulmonary edema. IMPRESSION: No active disease. Electronically Signed   By: Nolon Nations M.D.   On: 02/28/2019 14:23    Procedures Procedures (including critical care time)  Medications Ordered in ED Medications - No data to display   Initial Impression / Assessment and Plan / ED Course  I have reviewed the triage vital signs and the nursing notes.  Pertinent labs & imaging results that were available during my care of the patient were reviewed by me and considered in my medical decision making (see  chart for details).        Patient presenting with cough and sore throat for the past week.  Chest x-ray is negative.  Lung sounds are decreased, but otherwise clear.  Strep test is negative.  COVID-19 test pending, continue home isolation.  Will treat for occult pneumonia with azithromycin considering patient's smoking history and likelihood of COPD.  Will treat with prednisone as well as Tussionex.  Follow-up to PCP if symptoms or not improving.  Smoking cessation discussed at length.  Patient understands and agrees with plan.  Patient vital  stable and discharged in satisfactory condition.  JADDA HUNSUCKER was evaluated in Emergency Department on 02/28/2019 for the symptoms described in the history of present illness. She was evaluated in the context of the global COVID-19 pandemic, which necessitated consideration that the patient might be at risk for infection with the SARS-CoV-2 virus that causes COVID-19. Institutional protocols and algorithms that pertain to the evaluation of patients at risk for COVID-19 are in a state of rapid change based on information released by regulatory bodies including the CDC and federal and state organizations. These policies and algorithms were followed during the patient's care in the ED.   Final Clinical Impressions(s) / ED Diagnoses   Final diagnoses:  Cough    ED Discharge Orders         Ordered    azithromycin (ZITHROMAX) 250 MG tablet  Daily     02/28/19 1528    predniSONE (DELTASONE) 50 MG tablet  Daily     02/28/19 1528    chlorpheniramine-HYDROcodone (TUSSIONEX PENNKINETIC ER) 10-8 MG/5ML SUER  Every 12 hours PRN     02/28/19 [address] PA-C 02/28/19 1554    Quintella Reichert, MD 03/01/19 1057

## 2019-02-28 NOTE — ED Triage Notes (Signed)
Pt. Stated, Alicia Rubio had a terrible cough and sore throat for 6 days.

## 2019-02-28 NOTE — Discharge Instructions (Signed)
Take prednisone with food in the morning until completed.  Take azithromycin until completed.  Take Tussionex twice daily as needed for cough.  Do not drive or operate machinery with this medication.  Please follow-up with your doctor if your symptoms are not improving.  Please return the emergency department develop any new or worsening symptoms including persistent fever of 100.4, shortness of breath, or any other concerning symptoms.  Please continue to try to quit smoking or at least begin cutting back.

## 2019-03-02 LAB — NOVEL CORONAVIRUS, NAA (HOSP ORDER, SEND-OUT TO REF LAB; TAT 18-24 HRS): SARS-CoV-2, NAA: NOT DETECTED

## 2019-03-17 ENCOUNTER — Other Ambulatory Visit: Payer: Self-pay | Admitting: Family Medicine

## 2019-03-17 DIAGNOSIS — Z1231 Encounter for screening mammogram for malignant neoplasm of breast: Secondary | ICD-10-CM

## 2019-03-25 DIAGNOSIS — E039 Hypothyroidism, unspecified: Secondary | ICD-10-CM | POA: Diagnosis not present

## 2019-05-04 ENCOUNTER — Ambulatory Visit
Admission: RE | Admit: 2019-05-04 | Discharge: 2019-05-04 | Disposition: A | Discharge status: Home / Self Care | Payer: BC Managed Care – PPO | Source: Ambulatory Visit | Attending: Family Medicine | Admitting: Family Medicine

## 2019-05-04 ENCOUNTER — Other Ambulatory Visit: Payer: Self-pay

## 2019-05-04 DIAGNOSIS — Z1231 Encounter for screening mammogram for malignant neoplasm of breast: Secondary | ICD-10-CM | POA: Diagnosis not present

## 2019-06-04 DIAGNOSIS — R05 Cough: Secondary | ICD-10-CM | POA: Diagnosis not present

## 2019-08-07 HISTORY — PX: POLYPECTOMY: SHX149

## 2019-08-07 HISTORY — PX: COLONOSCOPY: SHX174

## 2020-01-19 ENCOUNTER — Telehealth: Payer: Self-pay | Admitting: Gastroenterology

## 2020-01-19 NOTE — Telephone Encounter (Signed)
Hey Dr. Ardis Hughs, this patient is being referred to [place] for a colonoscopy. Her last colonoscopy was in 2014. I have the records and I am going to send them up to you for review. Patient has requested you as her doctor. Please review and advise. Thank you!

## 2020-01-20 NOTE — Telephone Encounter (Signed)
Colonoscopy 05/2013 Dr. Watt Climes for colon cancer screening.  Findings hemorrhoids, 4 subCM adenomas, normal TI.  Was recommended to have repeat at 5 year interval I think.    She needs colonoscopy now.  [facility] direct is appropriate.  Thanks

## 2020-01-25 NOTE — Telephone Encounter (Signed)
Spoke with patient, wants to schedule in September to have a morning appointment. Will call once that calendar comes out.

## 2020-02-25 ENCOUNTER — Other Ambulatory Visit (HOSPITAL_COMMUNITY)
Admission: RE | Admit: 2020-02-25 | Discharge: 2020-02-25 | Disposition: A | Discharge status: Home / Self Care | Payer: 59 | Source: Ambulatory Visit | Attending: Advanced Practice Midwife | Admitting: Advanced Practice Midwife

## 2020-02-25 ENCOUNTER — Other Ambulatory Visit: Payer: Self-pay | Admitting: Family Medicine

## 2020-02-25 DIAGNOSIS — Z124 Encounter for screening for malignant neoplasm of cervix: Secondary | ICD-10-CM | POA: Insufficient documentation

## 2020-02-26 LAB — CYTOLOGY - PAP
Comment: NEGATIVE
Diagnosis: NEGATIVE
High risk HPV: NEGATIVE

## 2020-03-11 ENCOUNTER — Encounter: Payer: Self-pay | Admitting: Gastroenterology

## 2020-03-28 ENCOUNTER — Other Ambulatory Visit: Payer: Self-pay | Admitting: Family Medicine

## 2020-03-28 DIAGNOSIS — Z1231 Encounter for screening mammogram for malignant neoplasm of breast: Secondary | ICD-10-CM

## 2020-04-27 ENCOUNTER — Ambulatory Visit (AMBULATORY_SURGERY_CENTER): Payer: Self-pay | Admitting: *Deleted

## 2020-04-27 ENCOUNTER — Other Ambulatory Visit: Payer: Self-pay

## 2020-04-27 VITALS — Ht 62.0 in | Wt 130.0 lb

## 2020-04-27 DIAGNOSIS — Z86010 Personal history of colonic polyps: Secondary | ICD-10-CM

## 2020-04-27 MED ORDER — NA SULFATE-K SULFATE-MG SULF 17.5-3.13-1.6 GM/177ML PO SOLN: 1.0000 | Freq: Once | ORAL | 0 refills | Status: AC

## 2020-04-27 NOTE — Progress Notes (Signed)

## 2020-05-04 ENCOUNTER — Other Ambulatory Visit: Payer: Self-pay

## 2020-05-04 ENCOUNTER — Ambulatory Visit
Admission: RE | Admit: 2020-05-04 | Discharge: 2020-05-04 | Disposition: A | Discharge status: Home / Self Care | Payer: 59 | Source: Ambulatory Visit | Attending: Family Medicine | Admitting: Family Medicine

## 2020-05-04 DIAGNOSIS — Z1231 Encounter for screening mammogram for malignant neoplasm of breast: Secondary | ICD-10-CM

## 2020-05-11 ENCOUNTER — Encounter: Payer: Self-pay | Admitting: Gastroenterology

## 2020-05-11 ENCOUNTER — Other Ambulatory Visit: Payer: Self-pay

## 2020-05-11 ENCOUNTER — Ambulatory Visit (AMBULATORY_SURGERY_CENTER): Payer: 59 | Admitting: Gastroenterology

## 2020-05-11 VITALS — BP 144/62 | HR 78 | Temp 98.9°F | Resp 19 | Ht 62.0 in | Wt 130.0 lb

## 2020-05-11 DIAGNOSIS — D123 Benign neoplasm of transverse colon: Secondary | ICD-10-CM | POA: Diagnosis not present

## 2020-05-11 DIAGNOSIS — Z860100 Personal history of colon polyps, unspecified: Secondary | ICD-10-CM

## 2020-05-11 DIAGNOSIS — D120 Benign neoplasm of cecum: Secondary | ICD-10-CM

## 2020-05-11 DIAGNOSIS — D122 Benign neoplasm of ascending colon: Secondary | ICD-10-CM

## 2020-05-11 MED ORDER — SODIUM CHLORIDE 0.9 % IV SOLN: 500.0000 mL | Freq: Once | INTRAVENOUS | Status: DC

## 2020-05-11 NOTE — Progress Notes (Signed)
Called to room to assist during endoscopic procedure.  Patient ID and intended procedure confirmed with present staff. Received instructions for my participation in the procedure from the performing physician.  

## 2020-05-11 NOTE — Op Note (Signed)
[facility] Patient Name: Alicia Rubio Procedure Date: 05/11/2020 10:43 AM MRN: [id number] Endoscopist: Milus Banister , MD Age: 59 Referring MD:  Date of Birth: [birth year] Gender: Female Account #: [id number] Procedure:                Colonoscopy Indications:              High risk colon cancer surveillance: Personal                            history of colonic polyps: Colonoscopy Dr. Watt Climes                            2014 4 subCM adenomas removed Medicines:                Monitored Anesthesia Care Procedure:                Pre-Anesthesia Assessment:                           - Prior to the procedure, a History and Physical                            was performed, and patient medications and                            allergies were reviewed. The patient's tolerance of                            previous anesthesia was also reviewed. The risks                            and benefits of the procedure and the sedation                            options and risks were discussed with the patient.                            All questions were answered, and informed consent                            was obtained. Prior Anticoagulants: The patient has                            taken no previous anticoagulant or antiplatelet                            agents. ASA Grade Assessment: II - A patient with                            mild systemic disease. After reviewing the risks                            and benefits, the patient was deemed in  satisfactory condition to undergo the procedure.                           After obtaining informed consent, the colonoscope                            was passed under direct vision. Throughout the                            procedure, the patient's blood pressure, pulse, and                            oxygen saturations were monitored continuously. The                            Colonoscope was introduced through  the anus and                            advanced to the the cecum, identified by                            appendiceal orifice and ileocecal valve. The                            colonoscopy was performed without difficulty. The                            patient tolerated the procedure well. The quality                            of the bowel preparation was good. The ileocecal                            valve, appendiceal orifice, and rectum were                            photographed. Scope In: 10:50:16 AM Scope Out: 11:08:02 AM Scope Withdrawal Time: 0 hours 13 minutes 30 seconds  Total Procedure Duration: 0 hours 17 minutes 46 seconds  Findings:                 A 1 mm polyp was found in the cecum. The polyp was                            sessile. The polyp was removed with a cold biopsy                            forceps. Resection and retrieval were complete.                           Three sessile polyps were found in the transverse                            colon and ascending colon. The polyps were 2 to  4                            mm in size. These polyps were removed with a cold                            snare. Resection and retrieval were complete.                           A 20 mm polyp was found in the proximal transverse                            colon. The polyp was heaped up. The polyp was                            removed with a hot snare. Resection and retrieval                            were complete. Area was tattooed with two submucal                            injections of Spot (carbon black).                           Multiple small-mouthed diverticula were found in                            the left colon.                           The exam was otherwise without abnormality on                            direct and retroflexion views. Complications:            No immediate complications. Estimated blood loss:                            None. Estimated  Blood Loss:     Estimated blood loss: none. Impression:               - One 1 mm polyp in the cecum, removed with a cold                            biopsy forceps. Resected and retrieved. jar 1                           - Three 2 to 4 mm polyps in the transverse colon                            and in the ascending colon, removed with a cold                            snare. Resected and retrieved. jar 1                           -  One 20 mm polyp in the proximal transverse colon,                            removed with a hot snare. Resected and retrieved.                            Jar 2. Tattooed with submucosal injection of Spot.                           - Diverticulosis in the left colon.                           - The examination was otherwise normal on direct                            and retroflexion views. Recommendation:           - Patient has a contact number available for                            emergencies. The signs and symptoms of potential                            delayed complications were discussed with the                            patient. Return to normal activities tomorrow.                            Written discharge instructions were provided to the                            patient.                           - Resume previous diet.                           - Continue present medications.                           - Await pathology results. Milus Banister, MD 05/11/2020 11:13:09 AM This report has been signed electronically.

## 2020-05-11 NOTE — Progress Notes (Signed)
Report to PACU, RN, vss, BBS= Clear.  

## 2020-05-11 NOTE — Patient Instructions (Signed)
Handout provided on polyps.   YOU HAD AN ENDOSCOPIC PROCEDURE TODAY AT THE [facility]:   Refer to the procedure report that was given to you for any specific questions about what was found during the examination.  If the procedure report does not answer your questions, please call your gastroenterologist to clarify.  If you requested that your care partner not be given the details of your procedure findings, then the procedure report has been included in a sealed envelope for you to review at your convenience later.  YOU SHOULD EXPECT: Some feelings of bloating in the abdomen. Passage of more gas than usual.  Walking can help get rid of the air that was put into your GI tract during the procedure and reduce the bloating. If you had a lower endoscopy (such as a colonoscopy or flexible sigmoidoscopy) you may notice spotting of blood in your stool or on the toilet paper. If you underwent a bowel prep for your procedure, you may not have a normal bowel movement for a few days.  Please Note:  You might notice some irritation and congestion in your nose or some drainage.  This is from the oxygen used during your procedure.  There is no need for concern and it should clear up in a day or so.  SYMPTOMS TO REPORT IMMEDIATELY:  Following lower endoscopy (colonoscopy or flexible sigmoidoscopy):  Excessive amounts of blood in the stool  Significant tenderness or worsening of abdominal pains  Swelling of the abdomen that is new, acute  Fever of 100F or higher  For urgent or emergent issues, a gastroenterologist can be reached at any hour by calling [phone]. Do not use MyChart messaging for urgent concerns.    DIET:  We do recommend a small meal at first, but then you may proceed to your regular diet.  Drink plenty of fluids but you should avoid alcoholic beverages for 24 hours.  ACTIVITY:  You should plan to take it easy for the rest of today and you should NOT DRIVE or use heavy  machinery until tomorrow (because of the sedation medicines used during the test).    FOLLOW UP: Our staff will call the number listed on your records 48-72 hours following your procedure to check on you and address any questions or concerns that you may have regarding the information given to you following your procedure. If we do not reach you, we will leave a message.  We will attempt to reach you two times.  During this call, we will ask if you have developed any symptoms of COVID 19. If you develop any symptoms (ie: fever, flu-like symptoms, shortness of breath, cough etc.) before then, please call [phone].  If you test positive for Covid 19 in the 2 weeks post procedure, please call and report this information to us.    If any biopsies were taken you will be contacted by phone or by letter within the next 1-3 weeks.  Please call us at [phone] if you have not heard about the biopsies in 3 weeks.    SIGNATURES/CONFIDENTIALITY: You and/or your care partner have signed paperwork which will be entered into your electronic medical record.  These signatures attest to the fact that that the information above on your After Visit Summary has been reviewed and is understood.  Full responsibility of the confidentiality of this discharge information lies with you and/or your care-partner.  

## 2020-05-11 NOTE — Progress Notes (Signed)
Pt's states no medical or surgical changes since previsit or office visit.  VS by SM 

## 2020-05-13 ENCOUNTER — Telehealth: Payer: Self-pay | Admitting: *Deleted

## 2020-05-13 NOTE — Telephone Encounter (Signed)
°  Follow up Call-  Call back number 05/11/2020  Post procedure Call Back phone  # [phone]  Permission to leave phone message Yes  Some recent data might be hidden     First attempt for follow up phone call. No answer at number given.  Left message on voicemail.

## 2020-05-13 NOTE — Telephone Encounter (Signed)
°  Follow up Call-  Call back number 05/11/2020  Post procedure Call Back phone  # [phone]  Permission to leave phone message Yes  Some recent data might be hidden     Patient questions:  Do you have a fever, pain , or abdominal swelling? No. Pain Score  0 *  Have you tolerated food without any problems? Yes.    Have you been able to return to your normal activities? Yes.    Do you have any questions about your discharge instructions: Diet   No. Medications  No. Follow up visit  No.  Do you have questions or concerns about your Care? No.  Actions: * If pain score is 4 or above: No action needed, pain <4.  1. Have you developed a fever since your procedure? no  2.   Have you had an respiratory symptoms (SOB or cough) since your procedure? no  3.   Have you tested positive for COVID 19 since your procedure no  4.   Have you had any family members/close contacts diagnosed with the COVID 19 since your procedure?  no   If yes to any of these questions please route to Joylene John, RN and Joella Prince, RN

## 2020-08-09 ENCOUNTER — Encounter: Payer: Self-pay | Admitting: Gastroenterology

## 2020-09-19 ENCOUNTER — Ambulatory Visit (AMBULATORY_SURGERY_CENTER): Payer: Self-pay

## 2020-09-19 ENCOUNTER — Other Ambulatory Visit: Payer: Self-pay

## 2020-09-19 VITALS — Ht 62.0 in | Wt 132.0 lb

## 2020-09-19 DIAGNOSIS — Z86010 Personal history of colonic polyps: Secondary | ICD-10-CM

## 2020-09-19 NOTE — Progress Notes (Signed)
No egg or soy allergy known to patient  No issues with past sedation with any surgeries or procedures No intubation problems in the past  No FH of Malignant Hyperthermia No diet pills per patient No home 02 use per patient  No blood thinners per patient  Pt denies issues with constipation  No A fib or A flutter  EMMI video via MyChart  COVID 19 guidelines implemented in [place] today with Pt and RN  Pt is fully vaccinated for Covid x 2= Pt denies loose or missing teeth Patient denies dentures, partials, dental implants, or bonded teeth;  Patient reports numerous crowns (1 broken but pt reports "it does not bother me"; Discussed with pt there will be an out-of-pocket cost for prep  Due to the COVID-19 pandemic we are asking patients to follow certain guidelines.  Pt aware of COVID protocols and [facility] guidelines

## 2020-10-03 ENCOUNTER — Ambulatory Visit (AMBULATORY_SURGERY_CENTER): Payer: 59 | Admitting: Gastroenterology

## 2020-10-03 ENCOUNTER — Other Ambulatory Visit: Payer: Self-pay

## 2020-10-03 ENCOUNTER — Encounter: Payer: Self-pay | Admitting: Gastroenterology

## 2020-10-03 VITALS — BP 123/66 | HR 70 | Temp 97.1°F | Resp 16 | Ht 62.0 in | Wt 132.0 lb

## 2020-10-03 DIAGNOSIS — Z86010 Personal history of colonic polyps: Secondary | ICD-10-CM | POA: Diagnosis not present

## 2020-10-03 DIAGNOSIS — K635 Polyp of colon: Secondary | ICD-10-CM | POA: Diagnosis not present

## 2020-10-03 MED ORDER — SODIUM CHLORIDE 0.9 % IV SOLN: 500.0000 mL | Freq: Once | INTRAVENOUS | Status: DC

## 2020-10-03 NOTE — Patient Instructions (Signed)
YOU HAD AN ENDOSCOPIC PROCEDURE TODAY AT THE [facility]:   Refer to the procedure report that was given to you for any specific questions about what was found during the examination.  If the procedure report does not answer your questions, please call your gastroenterologist to clarify.  If you requested that your care partner not be given the details of your procedure findings, then the procedure report has been included in a sealed envelope for you to review at your convenience later.  YOU SHOULD EXPECT: Some feelings of bloating in the abdomen. Passage of more gas than usual.  Walking can help get rid of the air that was put into your GI tract during the procedure and reduce the bloating. If you had a lower endoscopy (such as a colonoscopy or flexible sigmoidoscopy) you may notice spotting of blood in your stool or on the toilet paper. If you underwent a bowel prep for your procedure, you may not have a normal bowel movement for a few days.  Please Note:  You might notice some irritation and congestion in your nose or some drainage.  This is from the oxygen used during your procedure.  There is no need for concern and it should clear up in a day or so.  SYMPTOMS TO REPORT IMMEDIATELY:   Following lower endoscopy (colonoscopy or flexible sigmoidoscopy):  Excessive amounts of blood in the stool  Significant tenderness or worsening of abdominal pains  Swelling of the abdomen that is new, acute  Fever of 100F or higher  For urgent or emergent issues, a gastroenterologist can be reached at any hour by calling [phone]. Do not use MyChart messaging for urgent concerns.    DIET:  We do recommend a small meal at first, but then you may proceed to your regular diet.  Drink plenty of fluids but you should avoid alcoholic beverages for 24 hours.  ACTIVITY:  You should plan to take it easy for the rest of today and you should NOT DRIVE or use heavy machinery until tomorrow (because  of the sedation medicines used during the test).    FOLLOW UP: Our staff will call the number listed on your records 48-72 hours following your procedure to check on you and address any questions or concerns that you may have regarding the information given to you following your procedure. If we do not reach you, we will leave a message.  We will attempt to reach you two times.  During this call, we will ask if you have developed any symptoms of COVID 19. If you develop any symptoms (ie: fever, flu-like symptoms, shortness of breath, cough etc.) before then, please call [phone].  If you test positive for Covid 19 in the 2 weeks post procedure, please call and report this information to us.    If any biopsies were taken you will be contacted by phone or by letter within the next 1-3 weeks.  Please call us at [phone] if you have not heard about the biopsies in 3 weeks.    SIGNATURES/CONFIDENTIALITY: You and/or your care partner have signed paperwork which will be entered into your electronic medical record.  These signatures attest to the fact that that the information above on your After Visit Summary has been reviewed and is understood.  Full responsibility of the confidentiality of this discharge information lies with you and/or your care-partner. 

## 2020-10-03 NOTE — Progress Notes (Signed)
AR - Check-in  NS - VS  Pt's states no medical or surgical changes since previsit or office visit.

## 2020-10-03 NOTE — Progress Notes (Signed)
PT taken to PACU. Monitors in place. VSS. Report given to RN. 

## 2020-10-03 NOTE — Op Note (Addendum)
[facility] Patient Name: Alicia Rubio Procedure Date: 10/03/2020 8:33 AM MRN: [id number] Endoscopist: Milus Banister , MD Age: 60 Referring MD:  Date of Birth: Sep 19, 1960 Gender: Female Account #: [id number] Procedure:                Flexible Sigmoidoscopy Medicines:                Monitored Anesthesia Care Procedure:                Pre-Anesthesia Assessment:                           - Prior to the procedure, a History and Physical                            was performed, and patient medications and                            allergies were reviewed. The patient's tolerance of                            previous anesthesia was also reviewed. The risks                            and benefits of the procedure and the sedation                            options and risks were discussed with the patient.                            All questions were answered, and informed consent                            was obtained. Prior Anticoagulants: The patient has                            taken no previous anticoagulant or antiplatelet                            agents. ASA Grade Assessment: II - A patient with                            mild systemic disease. After reviewing the risks                            and benefits, the patient was deemed in                            satisfactory condition to undergo the procedure.                           After obtaining informed consent, the scope was                            passed under direct vision. The Olympus CF-HQ190L                            (  [id number]) Colonoscope was introduced through the                            anus and advanced to the proximal transverse colon.                            The flexible sigmoidoscopy was accomplished without                            difficulty. The patient tolerated the procedure                            well. Scope In: 8:48:47 AM Scope Out: 9:01:55 AM Scope Withdrawal Time: 0  hours 6 minutes 29 seconds  Total Procedure Duration: 0 hours 13 minutes 8 seconds  Findings:                 The site of 05/2020 transverse colon polypectomy                            was easily located with aid of the previous Spot                            tattoo. There was no residual, recurrent                            adenomatous appearing mucosa. The scar at the site                            was sampled with forceps.                           The examination was otherwise normal (to the                            proximal transverse colon). Complications:            No immediate complications. Estimated blood loss:                            None. Estimated Blood Loss:     Estimated blood loss: none. Impression:               - The site of 05/2020 transverse colon polypectomy                            was easily located with aid of the previous Spot                            tattoo. There was no residual, recurrent                            adenomatous appearing mucosa. The scar at the site                            was sampled with  forceps.                           - The examination was otherwise normal (to the                            proximal transverse colon). Recommendation:           - Patient has a contact number available for                            emergencies. The signs and symptoms of potential                            delayed complications were discussed with the                            patient. Return to normal activities tomorrow.                            Written discharge instructions were provided to the                            patient.                           - Await pathology results. Milus Banister, MD 10/03/2020 9:09:33 AM This report has been signed electronically. Addendum Number: 1   Addendum Date: 10/25/2020 7:26:19 AM      THe indication for this procedure was "personal history of precancerous       colon polyps" Milus Banister, MD 10/25/2020 7:26:45 AM This report has been signed electronically.

## 2020-10-03 NOTE — Progress Notes (Signed)
Called to room to assist during endoscopic procedure.  Patient ID and intended procedure confirmed with present staff. Received instructions for my participation in the procedure from the performing physician.  

## 2020-10-05 ENCOUNTER — Telehealth: Payer: Self-pay

## 2020-10-05 NOTE — Telephone Encounter (Signed)
Left message on 2nd follow up call. 

## 2020-10-05 NOTE — Telephone Encounter (Signed)
No answer, left message to call back later today, B.Tujuana Kilmartin RN. 

## 2021-04-14 ENCOUNTER — Other Ambulatory Visit: Payer: Self-pay | Admitting: Family Medicine

## 2021-04-14 DIAGNOSIS — Z1231 Encounter for screening mammogram for malignant neoplasm of breast: Secondary | ICD-10-CM

## 2021-05-09 ENCOUNTER — Ambulatory Visit: Payer: 59

## 2021-08-08 ENCOUNTER — Ambulatory Visit: Payer: 59

## 2021-08-17 ENCOUNTER — Other Ambulatory Visit: Payer: Self-pay

## 2021-08-17 ENCOUNTER — Ambulatory Visit (INDEPENDENT_AMBULATORY_CARE_PROVIDER_SITE_OTHER): Payer: 59

## 2021-08-17 DIAGNOSIS — Z1231 Encounter for screening mammogram for malignant neoplasm of breast: Secondary | ICD-10-CM | POA: Diagnosis not present

## 2021-09-07 ENCOUNTER — Ambulatory Visit (HOSPITAL_COMMUNITY)
Admission: EM | Admit: 2021-09-07 | Discharge: 2021-09-07 | Disposition: A | Discharge status: Home / Self Care | Payer: 59 | Attending: Family Medicine | Admitting: Family Medicine

## 2021-09-07 ENCOUNTER — Other Ambulatory Visit: Payer: Self-pay

## 2021-09-07 ENCOUNTER — Encounter (HOSPITAL_COMMUNITY): Payer: Self-pay | Admitting: Emergency Medicine

## 2021-09-07 DIAGNOSIS — J069 Acute upper respiratory infection, unspecified: Secondary | ICD-10-CM

## 2021-09-07 DIAGNOSIS — J189 Pneumonia, unspecified organism: Secondary | ICD-10-CM | POA: Diagnosis not present

## 2021-09-07 MED ORDER — PREDNISONE 20 MG PO TABS: 40.0000 mg | ORAL_TABLET | Freq: Every day | ORAL | 0 refills | Status: AC

## 2021-09-07 MED ORDER — ALBUTEROL SULFATE HFA 108 (90 BASE) MCG/ACT IN AERS: 2.0000 | INHALATION_SPRAY | RESPIRATORY_TRACT | 0 refills | Status: DC | PRN

## 2021-09-07 MED ORDER — AZITHROMYCIN 250 MG PO TABS: 250.0000 mg | ORAL_TABLET | Freq: Every day | ORAL | 0 refills | Status: DC

## 2021-09-07 NOTE — ED Triage Notes (Signed)
Reports cough and congestion for 4 days. Has used mucinex, is concerned she has congestion built up in chest.

## 2021-09-07 NOTE — Discharge Instructions (Addendum)
You were seen today for cough, upper respiratory symptoms.  Given you history of smoking, I have sent out an antibiotic, as well as an inhaler and prednisone due to the wheezing on exam.  Please take these as directed.  I advise you cut back/stop smoking.  Please follow up if not improving as expected.
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
## 2022-04-11 DIAGNOSIS — I10 Essential (primary) hypertension: Secondary | ICD-10-CM | POA: Diagnosis not present

## 2022-04-11 DIAGNOSIS — E039 Hypothyroidism, unspecified: Secondary | ICD-10-CM | POA: Diagnosis not present

## 2022-04-23 ENCOUNTER — Other Ambulatory Visit: Payer: Self-pay | Admitting: Family Medicine

## 2022-04-23 DIAGNOSIS — R634 Abnormal weight loss: Secondary | ICD-10-CM

## 2022-04-24 ENCOUNTER — Other Ambulatory Visit: Payer: Self-pay | Admitting: Family Medicine

## 2022-04-24 DIAGNOSIS — R634 Abnormal weight loss: Secondary | ICD-10-CM

## 2022-04-27 DIAGNOSIS — R634 Abnormal weight loss: Secondary | ICD-10-CM | POA: Diagnosis not present

## 2022-05-01 ENCOUNTER — Ambulatory Visit
Admission: RE | Admit: 2022-05-01 | Discharge: 2022-05-01 | Disposition: A | Discharge status: Home / Self Care | Payer: 59 | Source: Ambulatory Visit | Attending: Family Medicine | Admitting: Family Medicine

## 2022-05-01 DIAGNOSIS — R634 Abnormal weight loss: Secondary | ICD-10-CM | POA: Diagnosis not present

## 2022-05-22 ENCOUNTER — Ambulatory Visit
Admission: RE | Admit: 2022-05-22 | Discharge: 2022-05-22 | Disposition: A | Discharge status: Home / Self Care | Payer: 59 | Source: Ambulatory Visit | Attending: Family Medicine | Admitting: Family Medicine

## 2022-05-22 DIAGNOSIS — I2510 Atherosclerotic heart disease of native coronary artery without angina pectoris: Secondary | ICD-10-CM | POA: Diagnosis not present

## 2022-05-22 DIAGNOSIS — R634 Abnormal weight loss: Secondary | ICD-10-CM | POA: Diagnosis not present

## 2022-05-22 DIAGNOSIS — R918 Other nonspecific abnormal finding of lung field: Secondary | ICD-10-CM | POA: Diagnosis not present

## 2022-05-22 DIAGNOSIS — M47814 Spondylosis without myelopathy or radiculopathy, thoracic region: Secondary | ICD-10-CM | POA: Diagnosis not present

## 2022-05-22 MED ORDER — IOPAMIDOL (ISOVUE-300) INJECTION 61%
75.0000 mL | Freq: Once | INTRAVENOUS | Status: AC | PRN
  Administered 2022-05-22: 75 mL via INTRAVENOUS

## 2022-05-28 ENCOUNTER — Ambulatory Visit (HOSPITAL_COMMUNITY)
Admission: EM | Admit: 2022-05-28 | Discharge: 2022-05-28 | Disposition: A | Discharge status: Home / Self Care | Payer: 59 | Attending: Family Medicine | Admitting: Family Medicine

## 2022-05-28 ENCOUNTER — Encounter (HOSPITAL_COMMUNITY): Payer: Self-pay | Admitting: Emergency Medicine

## 2022-05-28 DIAGNOSIS — M79641 Pain in right hand: Secondary | ICD-10-CM | POA: Diagnosis not present

## 2022-05-28 MED ORDER — AMOXICILLIN-POT CLAVULANATE 875-125 MG PO TABS: 1.0000 | ORAL_TABLET | Freq: Two times a day (BID) | ORAL | 0 refills | Status: DC

## 2022-05-28 MED ORDER — ACETAMINOPHEN-CODEINE 300-30 MG PO TABS: 1.0000 | ORAL_TABLET | Freq: Four times a day (QID) | ORAL | 0 refills | Status: DC | PRN

## 2022-05-28 NOTE — Discharge Instructions (Signed)
Be aware, you have been prescribed pain medications that may cause drowsiness. While taking this medication, do not take any other medications containing acetaminophen (Tylenol). Do not combine with alcohol or recreational drugs. Please do not drive, operate heavy machinery, or take part in activities that require making important decisions while on this medication as your judgement may be clouded.  

## 2022-05-28 NOTE — ED Triage Notes (Signed)
PT was bitten by her cat who is up to date on shouts. Pt has swelling at cat bite on RT radial site.
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
## 2022-08-14 ENCOUNTER — Other Ambulatory Visit: Payer: Self-pay | Admitting: Family Medicine

## 2022-08-14 DIAGNOSIS — Z1231 Encounter for screening mammogram for malignant neoplasm of breast: Secondary | ICD-10-CM

## 2022-09-28 DIAGNOSIS — I10 Essential (primary) hypertension: Secondary | ICD-10-CM | POA: Diagnosis not present

## 2022-09-28 DIAGNOSIS — F17200 Nicotine dependence, unspecified, uncomplicated: Secondary | ICD-10-CM | POA: Diagnosis not present

## 2022-09-28 DIAGNOSIS — J019 Acute sinusitis, unspecified: Secondary | ICD-10-CM | POA: Diagnosis not present

## 2022-09-28 DIAGNOSIS — R059 Cough, unspecified: Secondary | ICD-10-CM | POA: Diagnosis not present

## 2022-10-05 ENCOUNTER — Ambulatory Visit
Admission: RE | Admit: 2022-10-05 | Discharge: 2022-10-05 | Disposition: A | Discharge status: Home / Self Care | Payer: 59 | Source: Ambulatory Visit | Attending: Family Medicine | Admitting: Family Medicine

## 2022-10-05 DIAGNOSIS — Z1231 Encounter for screening mammogram for malignant neoplasm of breast: Secondary | ICD-10-CM

## 2022-10-10 DIAGNOSIS — E039 Hypothyroidism, unspecified: Secondary | ICD-10-CM | POA: Diagnosis not present

## 2022-10-10 DIAGNOSIS — I10 Essential (primary) hypertension: Secondary | ICD-10-CM | POA: Diagnosis not present

## 2022-10-31 ENCOUNTER — Other Ambulatory Visit: Payer: Self-pay | Admitting: Family Medicine

## 2022-10-31 DIAGNOSIS — R911 Solitary pulmonary nodule: Secondary | ICD-10-CM

## 2022-12-17 ENCOUNTER — Ambulatory Visit
Admission: RE | Admit: 2022-12-17 | Discharge: 2022-12-17 | Disposition: A | Discharge status: Home / Self Care | Payer: 59 | Source: Ambulatory Visit | Attending: Family Medicine | Admitting: Family Medicine

## 2022-12-17 DIAGNOSIS — R911 Solitary pulmonary nodule: Secondary | ICD-10-CM

## 2022-12-17 DIAGNOSIS — F17210 Nicotine dependence, cigarettes, uncomplicated: Secondary | ICD-10-CM | POA: Diagnosis not present

## 2022-12-17 DIAGNOSIS — J439 Emphysema, unspecified: Secondary | ICD-10-CM | POA: Diagnosis not present

## 2022-12-17 DIAGNOSIS — I700 Atherosclerosis of aorta: Secondary | ICD-10-CM | POA: Diagnosis not present

## 2022-12-17 DIAGNOSIS — R0602 Shortness of breath: Secondary | ICD-10-CM | POA: Diagnosis not present

## 2023-01-22 ENCOUNTER — Other Ambulatory Visit: Payer: Self-pay

## 2023-01-22 ENCOUNTER — Ambulatory Visit (HOSPITAL_COMMUNITY)
Admission: EM | Admit: 2023-01-22 | Discharge: 2023-01-22 | Disposition: A | Discharge status: Home / Self Care | Payer: 59 | Attending: Emergency Medicine | Admitting: Emergency Medicine

## 2023-01-22 ENCOUNTER — Encounter (HOSPITAL_COMMUNITY): Payer: Self-pay | Admitting: Emergency Medicine

## 2023-01-22 ENCOUNTER — Ambulatory Visit (INDEPENDENT_AMBULATORY_CARE_PROVIDER_SITE_OTHER): Payer: 59

## 2023-01-22 DIAGNOSIS — S9031XA Contusion of right foot, initial encounter: Secondary | ICD-10-CM | POA: Diagnosis not present

## 2023-01-22 DIAGNOSIS — S9030XA Contusion of unspecified foot, initial encounter: Secondary | ICD-10-CM | POA: Diagnosis not present

## 2023-01-22 NOTE — Discharge Instructions (Addendum)
Your x-rays appear negative for any fracture, deformity or dislocation.  We are waiting the official radiology overread, I will call you if anything results differently.  Please rest, ice, compress and elevate your foot.  Weightbearing as tolerated.  If your pain persist beyond the next week or so, I suggest following up with an orthopedic, who will be able to do advanced imaging and further evaluation.  Please return to clinic for any new or concerning symptoms.

## 2023-01-22 NOTE — ED Triage Notes (Signed)
Pt c/o fall on Sunday states she fell on concrete and she twisted her right foot when she fell. Pt endorse increase in swollen and pain.
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
## 2023-04-15 DIAGNOSIS — J441 Chronic obstructive pulmonary disease with (acute) exacerbation: Secondary | ICD-10-CM | POA: Diagnosis not present

## 2023-04-15 DIAGNOSIS — J309 Allergic rhinitis, unspecified: Secondary | ICD-10-CM | POA: Diagnosis not present

## 2023-04-15 DIAGNOSIS — I2510 Atherosclerotic heart disease of native coronary artery without angina pectoris: Secondary | ICD-10-CM | POA: Diagnosis not present

## 2023-04-15 DIAGNOSIS — G43909 Migraine, unspecified, not intractable, without status migrainosus: Secondary | ICD-10-CM | POA: Diagnosis not present

## 2023-04-15 DIAGNOSIS — F3340 Major depressive disorder, recurrent, in remission, unspecified: Secondary | ICD-10-CM | POA: Diagnosis not present

## 2023-04-15 DIAGNOSIS — Z0000 Encounter for general adult medical examination without abnormal findings: Secondary | ICD-10-CM | POA: Diagnosis not present

## 2023-04-15 DIAGNOSIS — G4700 Insomnia, unspecified: Secondary | ICD-10-CM | POA: Diagnosis not present

## 2023-04-15 DIAGNOSIS — I10 Essential (primary) hypertension: Secondary | ICD-10-CM | POA: Diagnosis not present

## 2023-04-15 DIAGNOSIS — F411 Generalized anxiety disorder: Secondary | ICD-10-CM | POA: Diagnosis not present

## 2023-04-15 DIAGNOSIS — E559 Vitamin D deficiency, unspecified: Secondary | ICD-10-CM | POA: Diagnosis not present

## 2023-04-15 DIAGNOSIS — E039 Hypothyroidism, unspecified: Secondary | ICD-10-CM | POA: Diagnosis not present

## 2023-04-15 DIAGNOSIS — Z720 Tobacco use: Secondary | ICD-10-CM | POA: Diagnosis not present

## 2023-06-25 ENCOUNTER — Encounter: Payer: Self-pay | Admitting: Gastroenterology

## 2023-06-25 ENCOUNTER — Ambulatory Visit (AMBULATORY_SURGERY_CENTER): Payer: 59

## 2023-06-25 VITALS — Ht 62.0 in | Wt 110.0 lb

## 2023-06-25 DIAGNOSIS — Z860100 Personal history of colon polyps, unspecified: Secondary | ICD-10-CM

## 2023-06-25 MED ORDER — NA SULFATE-K SULFATE-MG SULF 17.5-3.13-1.6 GM/177ML PO SOLN: 1.0000 | Freq: Once | ORAL | 0 refills | Status: AC

## 2023-06-25 NOTE — Progress Notes (Signed)
No egg or soy allergy known to patient  No issues known to pt with past sedation with any surgeries or procedures Patient denies ever being told they had issues or difficulty with intubation  No FH of Malignant Hyperthermia Pt is not on diet pills Pt is not on  home 02  Pt is not on blood thinners  Pt denies issues with constipation  No A fib or A flutter Have any cardiac testing pending--no Patient's chart reviewed by Cathlyn Parsons CNRA prior to previsit and patient appropriate for the [facility].  Previsit completed and red dot placed by patient's name on their procedure day (on provider's schedule).    Pt instructed to use Singlecare.com or GoodRx for a price reduction on prep  Ambulates independently Patient states she has smoked off and on since 1976

## 2023-07-03 ENCOUNTER — Other Ambulatory Visit: Payer: Self-pay | Admitting: Family Medicine

## 2023-07-03 DIAGNOSIS — R634 Abnormal weight loss: Secondary | ICD-10-CM

## 2023-07-07 ENCOUNTER — Encounter: Payer: Self-pay | Admitting: Certified Registered Nurse Anesthetist

## 2023-07-08 ENCOUNTER — Encounter: Payer: Self-pay | Admitting: Gastroenterology

## 2023-07-08 ENCOUNTER — Ambulatory Visit (AMBULATORY_SURGERY_CENTER): Payer: 59 | Admitting: Gastroenterology

## 2023-07-08 VITALS — BP 141/78 | HR 71 | Temp 97.4°F | Resp 17 | Ht 62.0 in | Wt 110.0 lb

## 2023-07-08 DIAGNOSIS — D125 Benign neoplasm of sigmoid colon: Secondary | ICD-10-CM

## 2023-07-08 DIAGNOSIS — Z1211 Encounter for screening for malignant neoplasm of colon: Secondary | ICD-10-CM | POA: Diagnosis not present

## 2023-07-08 DIAGNOSIS — Z860101 Personal history of adenomatous and serrated colon polyps: Secondary | ICD-10-CM

## 2023-07-08 DIAGNOSIS — K635 Polyp of colon: Secondary | ICD-10-CM | POA: Diagnosis not present

## 2023-07-08 DIAGNOSIS — I10 Essential (primary) hypertension: Secondary | ICD-10-CM | POA: Diagnosis not present

## 2023-07-08 DIAGNOSIS — D120 Benign neoplasm of cecum: Secondary | ICD-10-CM | POA: Diagnosis not present

## 2023-07-08 DIAGNOSIS — D123 Benign neoplasm of transverse colon: Secondary | ICD-10-CM | POA: Diagnosis not present

## 2023-07-08 DIAGNOSIS — F419 Anxiety disorder, unspecified: Secondary | ICD-10-CM | POA: Diagnosis not present

## 2023-07-08 DIAGNOSIS — J439 Emphysema, unspecified: Secondary | ICD-10-CM | POA: Diagnosis not present

## 2023-07-08 DIAGNOSIS — D122 Benign neoplasm of ascending colon: Secondary | ICD-10-CM

## 2023-07-08 MED ORDER — SODIUM CHLORIDE 0.9 % IV SOLN: 500.0000 mL | Freq: Once | INTRAVENOUS | Status: DC
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
## 2023-07-08 NOTE — Op Note (Signed)
[facility] Patient Name: Laparis Walloch Procedure Date: 07/08/2023 10:05 AM MRN: [id number] Endoscopist: Meryl Dare , MD, [phone] Age: 62 Referring MD:  Date of Birth: 1960-10-29 Gender: Female Account #: [id number] Procedure:                Colonoscopy Indications:              Surveillance: Personal history of tubulovillous                            adenomatous polyp with HGD on last colonoscopy 3                            years ago Medicines:                Monitored Anesthesia Care Procedure:                Pre-Anesthesia Assessment:                           - Prior to the procedure, a History and Physical                            was performed, and patient medications and                            allergies were reviewed. The patient's tolerance of                            previous anesthesia was also reviewed. The risks                            and benefits of the procedure and the sedation                            options and risks were discussed with the patient.                            All questions were answered, and informed consent                            was obtained. Prior Anticoagulants: The patient has                            taken no anticoagulant or antiplatelet agents. ASA                            Grade Assessment: III - A patient with severe                            systemic disease. After reviewing the risks and                            benefits, the patient was deemed in satisfactory  condition to undergo the procedure.                           After obtaining informed consent, the colonoscope                            was passed under direct vision. Throughout the                            procedure, the patient's blood pressure, pulse, and                            oxygen saturations were monitored continuously. The                            Olympus Scope SN: [id number] was introduced through                             the anus and advanced to the the cecum, identified                            by appendiceal orifice and ileocecal valve. The                            ileocecal valve, appendiceal orifice, and rectum                            were photographed. The quality of the bowel                            preparation was adequate. The colonoscopy was                            performed without difficulty. The patient tolerated                            the procedure well. Scope In: 10:09:32 AM Scope Out: 10:36:18 AM Scope Withdrawal Time: 0 hours 22 minutes 4 seconds  Total Procedure Duration: 0 hours 26 minutes 46 seconds  Findings:                 The perianal and digital rectal examinations were                            normal.                           Thirteen sessile polyps were found in the sigmoid                            colon (6), transverse colon (4), ascending colon                            (1) and cecum (2). The polyps were 4 to 8 mm in  size. These polyps were removed with a cold snare.                            Resection and retrieval were complete.                           Two sessile polyps were found in the transverse                            colon. The polyps were 2 to 3 mm in size. These                            polyps were removed with a cold biopsy forceps.                            Resection and retrieval were complete.                           A tattoo was seen in the transverse colon. A                            post-polypectomy scar was found at the tattoo site.                            There was no evidence of residual polyp tissue.                           The exam was otherwise without abnormality on                            direct and retroflexion views. Complications:            No immediate complications. Estimated blood loss:                            None. Estimated Blood Loss:      Estimated blood loss: none. Impression:               - Thirteen 4 to 8 mm polyps in the sigmoid colon,                            in the transverse colon, in the ascending colon and                            in the cecum, removed with a cold snare. Resected                            and retrieved.                           - Two 2 to 3 mm polyps in the transverse colon,                            removed with  a cold biopsy forceps. Resected and                            retrieved.                           - A tattoo was seen in the transverse colon. A                            post-polypectomy scar was found at the tattoo site.                            There was no evidence of residual polyp tissue.                           - The examination was otherwise normal on direct                            and retroflexion views. Recommendation:           - Repeat colonoscopy after studies are complete for                            surveillance based on pathology results.                           - Patient has a contact number available for                            emergencies. The signs and symptoms of potential                            delayed complications were discussed with the                            patient. Return to normal activities tomorrow.                            Written discharge instructions were provided to the                            patient.                           - Resume previous diet.                           - Continue present medications.                           - Await pathology results.                           - No aspirin, ibuprofen, naproxen, or other  non-steroidal anti-inflammatory drugs for 2 weeks                            after polyp removal. Meryl Dare, MD 07/08/2023 10:42:43 AM This report has been signed electronically.

## 2023-07-08 NOTE — Progress Notes (Signed)
1015 BP 177/102, Labetalol given IV, MD update, vss

## 2023-07-08 NOTE — Progress Notes (Signed)
Pt's states no medical or surgical changes since previsit or office visit. 

## 2023-07-08 NOTE — Patient Instructions (Addendum)
Educational handout provided to patient related to Polyps  Resume previous diet  Continue present medications- NO ASPIRIN, IBUPROFEN, NAPROXEN, OR OTHER NON-STEROIDAL ANTI-INFLAMMATORY DRUGS FOR 2 WEEKS AFTER POLYP REMOVAL  Awaiting pathology results   YOU HAD AN ENDOSCOPIC PROCEDURE TODAY AT THE [facility]:   Refer to the procedure report that was given to you for any specific questions about what was found during the examination.  If the procedure report does not answer your questions, please call your gastroenterologist to clarify.  If you requested that your care partner not be given the details of your procedure findings, then the procedure report has been included in a sealed envelope for you to review at your convenience later.  YOU SHOULD EXPECT: Some feelings of bloating in the abdomen. Passage of more gas than usual.  Walking can help get rid of the air that was put into your GI tract during the procedure and reduce the bloating. If you had a lower endoscopy (such as a colonoscopy or flexible sigmoidoscopy) you may notice spotting of blood in your stool or on the toilet paper. If you underwent a bowel prep for your procedure, you may not have a normal bowel movement for a few days.  Please Note:  You might notice some irritation and congestion in your nose or some drainage.  This is from the oxygen used during your procedure.  There is no need for concern and it should clear up in a day or so.  SYMPTOMS TO REPORT IMMEDIATELY:  Following lower endoscopy (colonoscopy or flexible sigmoidoscopy):  Excessive amounts of blood in the stool  Significant tenderness or worsening of abdominal pains  Swelling of the abdomen that is new, acute  Fever of 100F or higher  For urgent or emergent issues, a gastroenterologist can be reached at any hour by calling [phone]. Do not use MyChart messaging for urgent concerns.    DIET:  We do recommend a small meal at first, but  then you may proceed to your regular diet.  Drink plenty of fluids but you should avoid alcoholic beverages for 24 hours.  ACTIVITY:  You should plan to take it easy for the rest of today and you should NOT DRIVE or use heavy machinery until tomorrow (because of the sedation medicines used during the test).    FOLLOW UP: Our staff will call the number listed on your records the next business day following your procedure.  We will call around 7:15- 8:00 am to check on you and address any questions or concerns that you may have regarding the information given to you following your procedure. If we do not reach you, we will leave a message.     If any biopsies were taken you will be contacted by phone or by letter within the next 1-3 weeks.  Please call us at [phone] if you have not heard about the biopsies in 3 weeks.    SIGNATURES/CONFIDENTIALITY: You and/or your care partner have signed paperwork which will be entered into your electronic medical record.  These signatures attest to the fact that that the information above on your After Visit Summary has been reviewed and is understood.  Full responsibility of the confidentiality of this discharge information lies with you and/or your care-partner.

## 2023-07-08 NOTE — Progress Notes (Signed)
Report given to PACU, vss 

## 2023-07-08 NOTE — Progress Notes (Signed)
1025 BP 170/94, Labetalol given IV, MD update, vss

## 2023-07-08 NOTE — Progress Notes (Signed)
Called to room to assist during endoscopic procedure.  Patient ID and intended procedure confirmed with present staff. Received instructions for my participation in the procedure from the performing physician.  

## 2023-07-09 ENCOUNTER — Telehealth: Payer: Self-pay | Admitting: *Deleted

## 2023-07-09 NOTE — Telephone Encounter (Signed)
Attempted f/u phone call. No answer. Left message. °

## 2023-07-10 LAB — SURGICAL PATHOLOGY

## 2023-07-15 ENCOUNTER — Encounter: Payer: Self-pay | Admitting: Gastroenterology

## 2023-07-16 DIAGNOSIS — R634 Abnormal weight loss: Secondary | ICD-10-CM | POA: Diagnosis not present

## 2023-07-24 ENCOUNTER — Ambulatory Visit
Admission: RE | Admit: 2023-07-24 | Discharge: 2023-07-24 | Disposition: A | Discharge status: Home / Self Care | Payer: 59 | Source: Ambulatory Visit | Attending: Family Medicine | Admitting: Family Medicine

## 2023-07-24 DIAGNOSIS — R634 Abnormal weight loss: Secondary | ICD-10-CM | POA: Diagnosis not present

## 2023-07-24 DIAGNOSIS — I700 Atherosclerosis of aorta: Secondary | ICD-10-CM | POA: Diagnosis not present

## 2023-07-24 MED ORDER — IOPAMIDOL (ISOVUE-370) INJECTION 76%
200.0000 mL | Freq: Once | INTRAVENOUS | Status: AC | PRN
  Administered 2023-07-24: 75 mL via INTRAVENOUS

## 2023-10-11 DIAGNOSIS — E039 Hypothyroidism, unspecified: Secondary | ICD-10-CM | POA: Diagnosis not present

## 2023-10-11 DIAGNOSIS — I10 Essential (primary) hypertension: Secondary | ICD-10-CM | POA: Diagnosis not present

## 2023-11-19 ENCOUNTER — Other Ambulatory Visit: Payer: Self-pay | Admitting: Family Medicine

## 2023-11-19 DIAGNOSIS — Z1231 Encounter for screening mammogram for malignant neoplasm of breast: Secondary | ICD-10-CM

## 2023-11-25 DIAGNOSIS — R1031 Right lower quadrant pain: Secondary | ICD-10-CM | POA: Diagnosis not present

## 2023-11-25 DIAGNOSIS — F17210 Nicotine dependence, cigarettes, uncomplicated: Secondary | ICD-10-CM | POA: Diagnosis not present

## 2023-12-10 ENCOUNTER — Ambulatory Visit
Admission: RE | Admit: 2023-12-10 | Discharge: 2023-12-10 | Disposition: A | Discharge status: Home / Self Care | Source: Ambulatory Visit | Attending: Family Medicine | Admitting: Family Medicine

## 2023-12-10 DIAGNOSIS — Z1231 Encounter for screening mammogram for malignant neoplasm of breast: Secondary | ICD-10-CM | POA: Diagnosis not present

## 2024-03-04 ENCOUNTER — Encounter (HOSPITAL_COMMUNITY): Payer: Self-pay

## 2024-03-04 ENCOUNTER — Ambulatory Visit (HOSPITAL_COMMUNITY)
Admission: EM | Admit: 2024-03-04 | Discharge: 2024-03-04 | Disposition: A | Discharge status: Home / Self Care | Attending: Family Medicine | Admitting: Family Medicine

## 2024-03-04 DIAGNOSIS — W5501XA Bitten by cat, initial encounter: Secondary | ICD-10-CM | POA: Diagnosis not present

## 2024-03-04 DIAGNOSIS — L03114 Cellulitis of left upper limb: Secondary | ICD-10-CM

## 2024-03-04 MED ORDER — AMOXICILLIN-POT CLAVULANATE 875-125 MG PO TABS: 1.0000 | ORAL_TABLET | Freq: Two times a day (BID) | ORAL | 0 refills | Status: DC

## 2024-03-04 NOTE — ED Triage Notes (Signed)
 Patient presents to the office for cat bite to her left hand x 2 days. Patient reports redness and swelling to her hand. Patient cat is fully vaccinated.
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
## 2024-03-23 DIAGNOSIS — S61233A Puncture wound without foreign body of left middle finger without damage to nail, initial encounter: Secondary | ICD-10-CM | POA: Diagnosis not present

## 2024-03-23 DIAGNOSIS — W5501XA Bitten by cat, initial encounter: Secondary | ICD-10-CM | POA: Diagnosis not present

## 2024-03-27 ENCOUNTER — Encounter (HOSPITAL_COMMUNITY): Payer: Self-pay | Admitting: *Deleted

## 2024-03-27 ENCOUNTER — Emergency Department (HOSPITAL_COMMUNITY)
Admission: EM | Admit: 2024-03-27 | Discharge: 2024-03-27 | Disposition: A | Discharge status: Home / Self Care | Attending: Emergency Medicine | Admitting: Emergency Medicine

## 2024-03-27 ENCOUNTER — Other Ambulatory Visit: Payer: Self-pay

## 2024-03-27 DIAGNOSIS — E039 Hypothyroidism, unspecified: Secondary | ICD-10-CM | POA: Insufficient documentation

## 2024-03-27 DIAGNOSIS — S61253A Open bite of left middle finger without damage to nail, initial encounter: Secondary | ICD-10-CM | POA: Insufficient documentation

## 2024-03-27 DIAGNOSIS — W5501XA Bitten by cat, initial encounter: Secondary | ICD-10-CM | POA: Diagnosis not present

## 2024-03-27 DIAGNOSIS — Z79899 Other long term (current) drug therapy: Secondary | ICD-10-CM | POA: Diagnosis not present

## 2024-03-27 DIAGNOSIS — S61459A Open bite of unspecified hand, initial encounter: Secondary | ICD-10-CM | POA: Diagnosis not present

## 2024-03-27 DIAGNOSIS — I10 Essential (primary) hypertension: Secondary | ICD-10-CM | POA: Diagnosis not present

## 2024-03-27 MED ORDER — SULFAMETHOXAZOLE-TRIMETHOPRIM 800-160 MG PO TABS: 1.0000 | ORAL_TABLET | Freq: Two times a day (BID) | ORAL | 0 refills | Status: AC

## 2024-03-27 NOTE — ED Triage Notes (Signed)
 The pt had a cat bite on her lt middle finger last Saturday  the pt has been taking an antibiotic and today she she went  a picture to her doctor and he sent her here. She has a healing cut to her lt middle finger

## 2024-03-27 NOTE — ED Provider Notes (Signed)
 [place] EMERGENCY DEPARTMENT AT [facility] Provider Note   CSN: [id number] Arrival date & time: 03/27/24  1734     Patient presents with: Animal Bite   Alicia Rubio is a 63 y.o. female past medical history significant for hypertension, hypothyroidism, anxiety who presents to the emergency department for left middle finger swelling and erythema.  Patient states that she was recently bit by her cat and seen by her primary care provider in outpatient setting when she was started on Augmentin  on Monday.  Patient states that since starting antibiotic she has had worsening of the redness around her cat bite.  Patient states that she called her PCP today who suggested she come to the emergency department.  Patient denies associated decreased motor, fever, nausea, vomiting, abdominal pain, lymphadenopathy.    Animal Bite      Prior to Admission medications   Medication Sig Start Date End Date Taking? Authorizing Provider  sulfamethoxazole -trimethoprim  (BACTRIM  DS) 800-160 MG tablet Take 1 tablet by mouth 2 (two) times daily for 7 days. 03/27/24 04/03/24 Yes Nada Chroman, DO  ALPRAZolam (XANAX) 0.5 MG tablet Take 0.5 mg by mouth 3 (three) times daily as needed. 02/25/20   [provider]  amoxicillin -clavulanate (AUGMENTIN ) 875-125 MG tablet Take 1 tablet by mouth every 12 (twelve) hours. 03/04/24   Rolinda Rogue, MD  b complex vitamins capsule Take 1 capsule by mouth daily.    [provider]  Cholecalciferol (VITAMIN D3) 50 MCG (2000 UT) TABS 1,000 Int'l Units. 08/18/20   [provider]  levothyroxine (SYNTHROID, LEVOTHROID) 75 MCG tablet  05/28/16   [provider]  rosuvastatin (CRESTOR) 10 MG tablet Take 10 mg by mouth at bedtime. 05/09/23   [provider]  venlafaxine XR (EFFEXOR-XR) 150 MG 24 hr capsule Take 150 mg by mouth daily with breakfast. 06/14/23   [provider]  verapamil (CALAN) 120 MG tablet Take 120 mg  by mouth 3 (three) times daily.    [provider]    Allergies: Chantix [varenicline tartrate], Focalin [dexmethylphenidate hcl], Minocycline, and Trazodone hcl    Review of Systems  Updated Vital Signs BP (!) 151/83 (BP Location: Right Arm)   Pulse 92   Temp 99 F (37.2 C)   Resp (!) 23   Ht 5' 2 (1.575 m)   Wt 49.9 kg   SpO2 95%   BMI 20.12 kg/m   Physical Exam Vitals reviewed.  Constitutional:      General: She is not in acute distress.    Appearance: She is not ill-appearing.  Cardiovascular:     Rate and Rhythm: Normal rate.     Pulses:          Radial pulses are 2+ on the left side.  Pulmonary:     Effort: Pulmonary effort is normal. No tachypnea.  Musculoskeletal:     Comments: Please review image below for details:  Left upper extremity: Left third digit with wound with associated surrounding erythema and mild edema.  Left third digit with full active range of motion including flexion, extension, abduction, adduction, opposition.  No bony tenderness to palpation.  2+ radial pulse.  Normal capillary refill.  Neurological:     Mental Status: She is alert.      (all labs ordered are listed, but only abnormal results are displayed) Labs Reviewed - No data to display  EKG: None  Radiology: No results found.   Procedures   Medications Ordered in the ED - No data  to display                                  Medical Decision Making Risk Prescription drug management.   On initial evaluation patient is hemodynamically stable, afebrile and not in acute distress.  Based upon patient's history and physical examination differential diagnosis includes cellulitis, abscess, flexor tenosynovitis, osteomyelitis.  Based upon thorough physical exam do not believe patient's presentation is secondary to abscess as patient has no evidence of fluctuance or induration at the wound site.  Patient has full range of motion therefore flexor tenosynovitis less likely.   Less likely osteomyelitis as patient has no obvious bony tenderness with full range of motion and is hemodynamically stable and afebrile.  At this time believe patient's presentation is cellulitis secondary to cat bite.  Will recommend the patient continues Augmentin  however will add on Bactrim .  At this time believe patient is safe to be discharged with outpatient follow-up and strict return precautions.     Final diagnoses:  Cat bite, initial encounter    ED Discharge Orders          Ordered    sulfamethoxazole -trimethoprim  (BACTRIM  DS) 800-160 MG tablet  2 times daily        03/27/24 1902           Lavanda Bolster DO Emergency Medicine PGY2     Bolster Lavanda, DO 03/27/24 1932    Randol Simmonds, MD 03/28/24 [phone]

## 2024-03-27 NOTE — Discharge Instructions (Signed)
 Thank you for letting us  care for you today.  Please start taking Bactrim  twice daily for 7 days.  Please finish your Augmentin  (amoxicillin /clavulanate) prescription.  Please ensure to follow-up with your primary care provider.

## 2024-04-15 DIAGNOSIS — Z720 Tobacco use: Secondary | ICD-10-CM | POA: Diagnosis not present

## 2024-04-15 DIAGNOSIS — I2510 Atherosclerotic heart disease of native coronary artery without angina pectoris: Secondary | ICD-10-CM | POA: Diagnosis not present

## 2024-04-15 DIAGNOSIS — F411 Generalized anxiety disorder: Secondary | ICD-10-CM | POA: Diagnosis not present

## 2024-04-15 DIAGNOSIS — I10 Essential (primary) hypertension: Secondary | ICD-10-CM | POA: Diagnosis not present

## 2024-04-15 DIAGNOSIS — Z1331 Encounter for screening for depression: Secondary | ICD-10-CM | POA: Diagnosis not present

## 2024-04-15 DIAGNOSIS — E559 Vitamin D deficiency, unspecified: Secondary | ICD-10-CM | POA: Diagnosis not present

## 2024-04-15 DIAGNOSIS — E039 Hypothyroidism, unspecified: Secondary | ICD-10-CM | POA: Diagnosis not present

## 2024-04-15 DIAGNOSIS — J449 Chronic obstructive pulmonary disease, unspecified: Secondary | ICD-10-CM | POA: Diagnosis not present

## 2024-04-15 DIAGNOSIS — Z131 Encounter for screening for diabetes mellitus: Secondary | ICD-10-CM | POA: Diagnosis not present

## 2024-04-15 DIAGNOSIS — G4700 Insomnia, unspecified: Secondary | ICD-10-CM | POA: Diagnosis not present

## 2024-04-15 DIAGNOSIS — Z0000 Encounter for general adult medical examination without abnormal findings: Secondary | ICD-10-CM | POA: Diagnosis not present

## 2024-04-15 DIAGNOSIS — Z1159 Encounter for screening for other viral diseases: Secondary | ICD-10-CM | POA: Diagnosis not present

## 2024-04-15 DIAGNOSIS — Z23 Encounter for immunization: Secondary | ICD-10-CM | POA: Diagnosis not present

## 2024-04-27 ENCOUNTER — Telehealth: Payer: Self-pay | Admitting: Acute Care

## 2024-04-27 DIAGNOSIS — F17210 Nicotine dependence, cigarettes, uncomplicated: Secondary | ICD-10-CM

## 2024-04-27 DIAGNOSIS — Z122 Encounter for screening for malignant neoplasm of respiratory organs: Secondary | ICD-10-CM

## 2024-04-27 DIAGNOSIS — Z87891 Personal history of nicotine dependence: Secondary | ICD-10-CM

## 2024-04-27 NOTE — Telephone Encounter (Signed)
 Lung Cancer Screening Narrative/Criteria Questionnaire (Cigarette Smokers Only- No Cigars/Pipes/vapes)   Alicia Rubio   SDMV:05/01/24 at 1030a/Natalie                                           11/20/1960              LDCT: 05/06/24 at 11:40a/ [id number]    63 y.o.   Phone: [phone]  Lung Screening Narrative (confirm age 18-77 yrs Medicare / 50-80 yrs Private pay insurance)   Insurance information:HTA   Referring Provider:Shaw   This screening involves an initial phone call with a team member from our program. It is called a shared decision making visit. The initial meeting is required by insurance and Medicare to make sure you understand the program. This appointment takes about 15-20 minutes to complete. The CT scan will completed at a separate date/time. This scan takes about 5-10 minutes to complete and you may eat and drink before and after the scan.  Criteria questions for Lung Cancer Screening:   Are you a current or former smoker? Current Age began smoking: 15y   If you are a former smoker, what year did you quit smoking? Quit for about 24 years    To calculate your smoking history, I need an accurate estimate of how many packs of cigarettes you smoked per day and for how many years. (Not just the number of PPD you are now smoking)   Years smoking 24 x Packs per day 1-3pk = Pack years 36   (at least 20 pack yrs)   (Make sure they understand that we need to know how much they have smoked in the past, not just the number of PPD they are smoking now)  Do you have a personal history of cancer?  No    Do you have a family history of cancer? No (adopted/unsure)  Are you coughing up blood?  No  Have you had unexplained weight loss of 15 lbs or more in the last 6 months? No  It looks like you meet all criteria.     Additional information: N/A

## 2024-05-01 ENCOUNTER — Encounter: Payer: Self-pay | Admitting: *Deleted

## 2024-05-01 ENCOUNTER — Ambulatory Visit (INDEPENDENT_AMBULATORY_CARE_PROVIDER_SITE_OTHER): Admitting: *Deleted

## 2024-05-01 DIAGNOSIS — F17210 Nicotine dependence, cigarettes, uncomplicated: Secondary | ICD-10-CM

## 2024-05-01 NOTE — Patient Instructions (Signed)

## 2024-05-01 NOTE — Progress Notes (Signed)
  Virtual Visit via Telephone Note  I connected with Anice Millican Twedt on 05/01/24 at 10:30 AM EDT by telephone and verified that I am speaking with the correct person using two identifiers.  Location: Patient: at home Provider: [address], [place], [place], [address]    I discussed the limitations, risks, security and privacy concerns of performing an evaluation and management service by telephone and the availability of in person appointments. I also discussed with the patient that there may be a patient responsible charge related to this service. The patient expressed understanding and agreed to proceed.    Shared Decision Making Visit Lung Cancer Screening Program [phone])   Eligibility: Age 14 y.o. Pack Years Smoking History Calculation 47 (# packs/per year x # years smoked) Recent History of coughing up blood  no Unexplained weight loss? no ( >Than 15 pounds within the last 6 months ) Prior History Lung / other cancer no (Diagnosis within the last 5 years already requiring surveillance chest CT Scans). Smoking Status Current Smoker Former Smokers: Years since quit: n/a  Quit Date: n/a  Visit Components: Discussion included one or more decision making aids. yes Discussion included risk/benefits of screening. yes Discussion included potential follow up diagnostic testing for abnormal scans. yes Discussion included meaning and risk of over diagnosis. yes Discussion included meaning and risk of False Positives. yes Discussion included meaning of total radiation exposure. yes  Counseling Included: Importance of adherence to annual lung cancer LDCT screening. yes Impact of comorbidities on ability to participate in the program. yes Ability and willingness to under diagnostic treatment. yes  Smoking Cessation Counseling: Current Smokers:  Discussed importance of smoking cessation. yes Information about tobacco cessation classes and interventions provided to  patient. yes Patient provided with ticket for LDCT Scan. no Symptomatic Patient. no   Diagnosis Code: Tobacco Use Z72.0 Asymptomatic Patient yes   Counseled patient 4 minutes regarding tobacco use.   Former Smokers:  Discussed the importance of maintaining cigarette abstinence. yes Diagnosis Code: Personal History of Nicotine Dependence. S12.108 Information about tobacco cessation classes and interventions provided to patient. Yes Patient provided with ticket for LDCT Scan. no Written Order for Lung Cancer Screening with LDCT placed in Epic. Yes (CT Chest Lung Cancer Screening Low Dose W/O CM) [id number] Z12.2-Screening of respiratory organs Z87.891-Personal history of nicotine dependence   Laneta Speaks, RN

## 2024-05-06 ENCOUNTER — Other Ambulatory Visit

## 2024-05-06 DIAGNOSIS — F332 Major depressive disorder, recurrent severe without psychotic features: Secondary | ICD-10-CM | POA: Diagnosis not present

## 2024-05-27 ENCOUNTER — Inpatient Hospital Stay
Admission: RE | Admit: 2024-05-27 | Discharge: 2024-05-27 | Disposition: A | Source: Ambulatory Visit | Attending: Acute Care | Admitting: Acute Care

## 2024-05-27 DIAGNOSIS — Z87891 Personal history of nicotine dependence: Secondary | ICD-10-CM

## 2024-05-27 DIAGNOSIS — Z122 Encounter for screening for malignant neoplasm of respiratory organs: Secondary | ICD-10-CM

## 2024-05-27 DIAGNOSIS — F17210 Nicotine dependence, cigarettes, uncomplicated: Secondary | ICD-10-CM

## 2024-05-28 DIAGNOSIS — F332 Major depressive disorder, recurrent severe without psychotic features: Secondary | ICD-10-CM | POA: Diagnosis not present

## 2024-06-01 ENCOUNTER — Other Ambulatory Visit: Payer: Self-pay

## 2024-06-01 DIAGNOSIS — F17210 Nicotine dependence, cigarettes, uncomplicated: Secondary | ICD-10-CM

## 2024-06-01 DIAGNOSIS — Z122 Encounter for screening for malignant neoplasm of respiratory organs: Secondary | ICD-10-CM

## 2024-06-01 DIAGNOSIS — Z87891 Personal history of nicotine dependence: Secondary | ICD-10-CM

## 2024-06-04 DIAGNOSIS — R945 Abnormal results of liver function studies: Secondary | ICD-10-CM | POA: Diagnosis not present

## 2024-06-24 ENCOUNTER — Ambulatory Visit (AMBULATORY_SURGERY_CENTER)

## 2024-06-24 ENCOUNTER — Encounter: Payer: Self-pay | Admitting: Gastroenterology

## 2024-06-24 VITALS — Ht 62.0 in | Wt 116.0 lb

## 2024-06-24 DIAGNOSIS — Z860100 Personal history of colon polyps, unspecified: Secondary | ICD-10-CM

## 2024-06-24 MED ORDER — NA SULFATE-K SULFATE-MG SULF 17.5-3.13-1.6 GM/177ML PO SOLN: 1.0000 | Freq: Once | ORAL | 0 refills | Status: AC

## 2024-06-24 NOTE — Progress Notes (Signed)
 PCP MD at time of PV: Joen Gentry, MD __________________________________________________________________________________________________________________________________________  No egg allergy known to patient  No soy allergy known to patient No issues known to pt with past sedation with any surgeries or procedures Patient denies ever being told they had issues or difficulty with intubation  No FH of Malignant Hyperthermia Pt is not on diet pills Pt is not on  home 02  Pt is not on blood thinners  No A fib or A flutter Have any cardiac testing pending--no LOA: independent No Chew or Snuff tobacco __________________________________________________________________________________________________________________________________________  Constipation: no  Prep: suprep extra miralax  __________________________________________________________________________________________________________________________________________  PV completed with patient. Prep instructions reviewed and provided during apt. Rx sent to preferred pharmacy.  __________________________________________________________________________________________________________________________________________  Patient's chart reviewed by Norleen Schillings CNRA prior to previsit and patient appropriate for the [facility].  Previsit completed and red dot placed by patient's name on their procedure day (on provider's schedule).

## 2024-07-08 ENCOUNTER — Encounter: Payer: Self-pay | Admitting: Gastroenterology

## 2024-07-08 ENCOUNTER — Ambulatory Visit: Admitting: Gastroenterology

## 2024-07-08 VITALS — BP 148/53 | HR 74 | Temp 97.7°F | Resp 12 | Ht 62.0 in | Wt 116.0 lb

## 2024-07-08 DIAGNOSIS — D122 Benign neoplasm of ascending colon: Secondary | ICD-10-CM

## 2024-07-08 DIAGNOSIS — D120 Benign neoplasm of cecum: Secondary | ICD-10-CM | POA: Diagnosis not present

## 2024-07-08 DIAGNOSIS — D127 Benign neoplasm of rectosigmoid junction: Secondary | ICD-10-CM | POA: Diagnosis not present

## 2024-07-08 DIAGNOSIS — J439 Emphysema, unspecified: Secondary | ICD-10-CM | POA: Diagnosis not present

## 2024-07-08 DIAGNOSIS — K5730 Diverticulosis of large intestine without perforation or abscess without bleeding: Secondary | ICD-10-CM

## 2024-07-08 DIAGNOSIS — D123 Benign neoplasm of transverse colon: Secondary | ICD-10-CM

## 2024-07-08 DIAGNOSIS — Z860101 Personal history of adenomatous and serrated colon polyps: Secondary | ICD-10-CM

## 2024-07-08 DIAGNOSIS — Z1211 Encounter for screening for malignant neoplasm of colon: Secondary | ICD-10-CM | POA: Diagnosis not present

## 2024-07-08 DIAGNOSIS — I10 Essential (primary) hypertension: Secondary | ICD-10-CM | POA: Diagnosis not present

## 2024-07-08 DIAGNOSIS — K635 Polyp of colon: Secondary | ICD-10-CM | POA: Diagnosis not present

## 2024-07-08 DIAGNOSIS — Z860100 Personal history of colon polyps, unspecified: Secondary | ICD-10-CM

## 2024-07-08 DIAGNOSIS — F419 Anxiety disorder, unspecified: Secondary | ICD-10-CM | POA: Diagnosis not present

## 2024-07-08 MED ORDER — SODIUM CHLORIDE 0.9 % IV SOLN: 500.0000 mL | INTRAVENOUS | Status: DC

## 2024-07-08 NOTE — Op Note (Signed)
 [facility] Patient Name: Alicia Rubio Procedure Date: 07/08/2024 10:14 AM MRN: [id number] Endoscopist: Sandor Flatter , MD, [id number] Age: 63 Referring MD:  Date of Birth: Dec 03, 1960 Gender: Female Account #: [id number] Procedure:                Colonoscopy Indications:              Surveillance: History of numerous (> 10) adenomas                            on last colonoscopy (< 3 yrs)                           Colonoscopy in 2014 with 4 subcentimeter adenomas.                           Colonoscopy in 05/2020 with 1 mm cecal polyp, 3                            small 2-4 mm polyps removed in the transverse and                            ascending colon (all adenomas), 20 mm transverse                            colon polyp resected with hot snare and tattoo                            placement (path: tubulovillous adenoma with                            high-grade dysplasia), left-sided diverticulosis.                           Flexible sigmoidoscopy in 09/2020 with well-healed                            polypectomy site in the transverse colon and no                            residual polypoid tissue (biopsied: Benign                            pathology).                           Last colonoscopy was 07/2023 by Dr. Aneita and                            notable for 13 subcentimeter polyps scattered                            throughout the colon, 2 small 2-3 mm transverse                            colon polyps resected  with forceps (1 hyperplastic                            polyp, the rest were all adenomas). Tattoo with                            post polypectomy scar found in the transverse colon                            without residual polypoid tissue, and recommended                            short interval repeat colonoscopy for surveillance. Medicines:                Monitored Anesthesia Care Procedure:                Pre-Anesthesia Assessment:                            - Prior to the procedure, a History and Physical                            was performed, and patient medications and                            allergies were reviewed. The patient's tolerance of                            previous anesthesia was also reviewed. The risks                            and benefits of the procedure and the sedation                            options and risks were discussed with the patient.                            All questions were answered, and informed consent                            was obtained. Prior Anticoagulants: The patient has                            taken no anticoagulant or antiplatelet agents. ASA                            Grade Assessment: II - A patient with mild systemic                            disease. After reviewing the risks and benefits,                            the patient was deemed in satisfactory condition to  undergo the procedure.                           After obtaining informed consent, the colonoscope                            was passed under direct vision. Throughout the                            procedure, the patient's blood pressure, pulse, and                            oxygen saturations were monitored continuously. The                            PCF-HQ190L Colonoscope [id number] was introduced                            through the anus and advanced to the the cecum,                            identified by appendiceal orifice and ileocecal                            valve. The colonoscopy was performed without                            difficulty. The patient tolerated the procedure                            well. The quality of the bowel preparation was                            good. The ileocecal valve, appendiceal orifice, and                            rectum were photographed. Scope In: 10:26:03 AM Scope Out: 10:57:08 AM Scope Withdrawal Time: 0 hours 26  minutes 5 seconds  Total Procedure Duration: 0 hours 31 minutes 5 seconds  Findings:                 The perianal and digital rectal examinations were                            normal.                           A 3 mm polyp was found in the cecum. The polyp was                            sessile. The polyp was removed with a cold snare.                            Resection and retrieval were complete. Estimated  blood loss was minimal.                           Six sessile polyps were found in the transverse                            colon and ascending colon. The polyps were 2 to 5                            mm in size. These polyps were removed with a cold                            snare. Resection and retrieval were complete.                            Estimated blood loss was minimal.                           Seven sessile polyps were found in the                            recto-sigmoid colon and distal sigmoid colon. The                            polyps were 1 to 3 mm in size. These polyps were                            removed with a cold snare. Resection and retrieval                            were complete. Estimated blood loss was minimal.                           Multiple small-mouthed diverticula were found in                            the sigmoid colon, descending colon and transverse                            colon.                           A tattoo was seen in the transverse colon. There                            was no evidence of residual polyp tissue.                           The retroflexed view of the distal rectum and anal                            verge was normal and showed no anal or rectal  abnormalities. Complications:            No immediate complications. Estimated Blood Loss:     Estimated blood loss was minimal. Impression:               - One 3 mm polyp in the cecum, removed with a cold                             snare. Resected and retrieved.                           - Six 2 to 5 mm polyps in the transverse colon and                            in the ascending colon, removed with a cold snare.                            Resected and retrieved.                           - Seven 1 to 3 mm polyps at the recto-sigmoid colon                            and in the distal sigmoid colon, removed with a                            cold snare. Resected and retrieved.                           - Diverticulosis in the sigmoid colon, in the                            descending colon and in the transverse colon.                           - A tattoo was seen in the transverse colon. A                            post-polypectomy scar was found at the tattoo site.                            There was no evidence of residual polyp tissue.                           - The distal rectum and anal verge are normal on                            retroflexion view. Recommendation:           - Patient has a contact number available for                            emergencies. The signs and symptoms of potential  delayed complications were discussed with the                            patient. Return to normal activities tomorrow.                            Written discharge instructions were provided to the                            patient.                           - Resume previous diet.                           - Continue present medications.                           - Await pathology results.                           - Repeat colonoscopy in 1 year for surveillance.                           - Consider referral for Genetic testing given                            number of adenomatous polyps.                           - Return to GI office PRN. Sandor Flatter, MD 07/08/2024 11:06:27 AM
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
## 2024-07-08 NOTE — Progress Notes (Signed)
 Pt's states no medical or surgical changes since previsit or office visit.

## 2024-07-08 NOTE — Patient Instructions (Signed)
 YOU HAD AN ENDOSCOPIC PROCEDURE TODAY AT THE [facility]:   Refer to the procedure report that was given to you for any specific questions about what was found during the examination.  If the procedure report does not answer your questions, please call your gastroenterologist to clarify.  If you requested that your care partner not be given the details of your procedure findings, then the procedure report has been included in a sealed envelope for you to review at your convenience later.  YOU SHOULD EXPECT: Some feelings of bloating in the abdomen. Passage of more gas than usual.  Walking can help get rid of the air that was put into your GI tract during the procedure and reduce the bloating. If you had a lower endoscopy (such as a colonoscopy or flexible sigmoidoscopy) you may notice spotting of blood in your stool or on the toilet paper. If you underwent a bowel prep for your procedure, you may not have a normal bowel movement for a few days.  Please Note:  You might notice some irritation and congestion in your nose or some drainage.  This is from the oxygen used during your procedure.  There is no need for concern and it should clear up in a day or so.  SYMPTOMS TO REPORT IMMEDIATELY:  Following lower endoscopy (colonoscopy or flexible sigmoidoscopy):  Excessive amounts of blood in the stool  Significant tenderness or worsening of abdominal pains  Swelling of the abdomen that is new, acute  Fever of 100F or higher  Resume previous diet Continue present medications Await pathology results Repeat colonoscopy in 1 year Return to GI office as needed Consider referral for genetic testing given number of adenomatous polyps Handouts on polyps and diverticulosis given   For urgent or emergent issues, a gastroenterologist can be reached at any hour by calling [phone]. Do not use MyChart messaging for urgent concerns.    DIET:  We do recommend a small meal at first, but then  you may proceed to your regular diet.  Drink plenty of fluids but you should avoid alcoholic beverages for 24 hours.  ACTIVITY:  You should plan to take it easy for the rest of today and you should NOT DRIVE or use heavy machinery until tomorrow (because of the sedation medicines used during the test).    FOLLOW UP: Our staff will call the number listed on your records the next business day following your procedure.  We will call around 7:15- 8:00 am to check on you and address any questions or concerns that you may have regarding the information given to you following your procedure. If we do not reach you, we will leave a message.     If any biopsies were taken you will be contacted by phone or by letter within the next 1-3 weeks.  Please call us  at [phone] if you have not heard about the biopsies in 3 weeks.    SIGNATURES/CONFIDENTIALITY: You and/or your care partner have signed paperwork which will be entered into your electronic medical record.  These signatures attest to the fact that that the information above on your After Visit Summary has been reviewed and is understood.  Full responsibility of the confidentiality of this discharge information lies with you and/or your care-partner.

## 2024-07-08 NOTE — Progress Notes (Signed)
 Vss nad trans to pacu

## 2024-07-08 NOTE — Progress Notes (Signed)
 Called to room to assist during endoscopic procedure.  Patient ID and intended procedure confirmed with present staff. Received instructions for my participation in the procedure from the performing physician.

## 2024-07-09 ENCOUNTER — Telehealth: Payer: Self-pay

## 2024-07-09 NOTE — Telephone Encounter (Signed)
 Attempted to reach patient for follow up phone call. No answer, left voicemail to contact Dr. Andre Kawasaki office with any questions or concerns.

## 2024-07-10 LAB — SURGICAL PATHOLOGY

## 2024-07-15 ENCOUNTER — Ambulatory Visit: Payer: Self-pay | Admitting: Gastroenterology
